# Patient Record
Sex: Female | Born: 1990 | Race: Black or African American | Hispanic: No | Marital: Single | Smoking: Former smoker
Health system: Southern US, Community
[De-identification: ages and names within clinical notes are randomized; demographics above are authoritative.]

## PROBLEM LIST (undated history)

## (undated) ENCOUNTER — Inpatient Hospital Stay (HOSPITAL_COMMUNITY): Payer: Self-pay

## (undated) DIAGNOSIS — B9689 Other specified bacterial agents as the cause of diseases classified elsewhere: Secondary | ICD-10-CM

## (undated) DIAGNOSIS — A749 Chlamydial infection, unspecified: Secondary | ICD-10-CM

## (undated) DIAGNOSIS — N760 Acute vaginitis: Secondary | ICD-10-CM

## (undated) DIAGNOSIS — D649 Anemia, unspecified: Secondary | ICD-10-CM

---
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
## 2011-04-02 NOTE — L&D Delivery Note (Signed)
Delivery Note At 4:07 AM a viable female was delivered via Vaginal, Spontaneous Delivery (Presentation: left occiput anterior )  APGAR: 9, 10; weight 7 lb 1.2 oz (3210 g).   Placenta status: Intact, Expressed.  Cord: 3 vessels with the following complications: None.   Pt came with8 cm and complete limited and late prenatal care. Dating by 18 weeks ultrasound. Prior low transverse C section.  Anesthesia: None  Episiotomy: None Lacerations: 1st degree Suture Repair: 3.0 vicryl rapide Est. Blood Loss (mL): 450  Mom to postpartum.  Baby to nursery-stable.  PILOTO, Maycen Degregory 02/20/2012, 4:47 AM

## 2011-04-02 NOTE — L&D Delivery Note (Signed)
I was present for entire delivery and repair. Patient admitted at 8 cm with no prenatal care and history of c-section for arrest of descent with 8 lb baby. Patient had SROM at 8 cm and became completely dilated within 10 minutes. She pushed through several contractions and delivered with no complications. Baby cried vigorously immediately after delivery.  Napoleon Form, MD

## 2011-05-01 ENCOUNTER — Encounter (HOSPITAL_COMMUNITY): Payer: Self-pay | Admitting: *Deleted

## 2011-05-01 ENCOUNTER — Emergency Department (INDEPENDENT_AMBULATORY_CARE_PROVIDER_SITE_OTHER)
Admission: EM | Admit: 2011-05-01 | Discharge: 2011-05-01 | Disposition: A | Discharge status: Home / Self Care | Payer: Medicaid Other | Source: Home / Self Care | Attending: Family Medicine | Admitting: Family Medicine

## 2011-05-01 DIAGNOSIS — R112 Nausea with vomiting, unspecified: Secondary | ICD-10-CM

## 2011-05-01 MED ORDER — ONDANSETRON 4 MG PO TBDP
ORAL_TABLET | ORAL | Status: AC
  Filled 2011-05-01: qty 1

## 2011-05-01 MED ORDER — ONDANSETRON HCL 4 MG PO TABS: 4.0000 mg | ORAL_TABLET | Freq: Four times a day (QID) | ORAL | Status: AC

## 2011-05-01 MED ORDER — ONDANSETRON 4 MG PO TBDP
4.0000 mg | ORAL_TABLET | Freq: Once | ORAL | Status: AC
  Administered 2011-05-01: 4 mg via ORAL

## 2011-05-01 NOTE — ED Provider Notes (Signed)
History     CSN: [id number]  Arrival date & time 05/01/11  1806   First MD Initiated Contact with Patient 05/01/11 1908      Chief Complaint  Patient presents with  . Emesis    (Consider location/radiation/quality/duration/timing/severity/associated sxs/prior treatment) Patient is a 21 y.o. female presenting with vomiting. The history is provided by the patient.  Emesis  This is a new problem. The current episode started 6 to 12 hours ago. The problem has been resolved (feels like going out now to eat some fried chicken). The emesis has an appearance of stomach contents. There has been no fever. Pertinent negatives include no chills, no diarrhea and no fever. Risk factors include ill contacts.    History reviewed. No pertinent past medical history.  History reviewed. No pertinent past surgical history.  History reviewed. No pertinent family history.  History  Substance Use Topics  . Smoking status: Current Everyday Smoker  . Smokeless tobacco: Not on file  . Alcohol Use:     OB History    Grav Para Term Preterm Abortions TAB SAB Ect Mult Living                  Review of Systems  Constitutional: Negative for fever and chills.  HENT: Negative.   Respiratory: Negative.   Cardiovascular: Negative.   Gastrointestinal: Positive for vomiting. Negative for diarrhea.    Allergies  Review of patient's allergies indicates no known allergies.  Home Medications   Current Outpatient Rx  Name Route Sig Dispense Refill  . ONDANSETRON HCL 4 MG PO TABS Oral Take 1 tablet (4 mg total) by mouth every 6 (six) hours. 6 tablet 0    BP 107/63  Pulse 72  Temp(Src) 98.5 F (36.9 C) (Oral)  Resp 16  SpO2 100%  LMP 04/27/2011  Physical Exam  Nursing note and vitals reviewed. Constitutional: She is oriented to person, place, and time. She appears well-developed and well-nourished.  Abdominal: Soft. Bowel sounds are normal. She exhibits no mass. There is no tenderness. There is  no rebound and no guarding.  Neurological: She is alert and oriented to person, place, and time.  Skin: Skin is warm and dry.  Psychiatric: She has a normal mood and affect.    ED Course  Procedures (including critical care time)  Labs Reviewed - No data to display No results found.   1. N&V - nausea and vomiting       MDM         Barkley Bruns, MD 05/03/11 [phone]

## 2011-05-01 NOTE — ED Notes (Signed)
Pt  States  She  Has  Had  Some  Vomiting  Earlier  Today    Better  Now  Had  Some  Mild  abd  Cramping   Earlier      No  Diarrhea  virus  Is  Going  Around  Her  Family

## 2011-07-08 ENCOUNTER — Encounter (HOSPITAL_COMMUNITY): Payer: Self-pay | Admitting: Emergency Medicine

## 2011-07-08 ENCOUNTER — Emergency Department (HOSPITAL_COMMUNITY)
Admission: EM | Admit: 2011-07-08 | Discharge: 2011-07-08 | Disposition: A | Discharge status: Home / Self Care | Payer: Medicaid Other | Attending: Emergency Medicine | Admitting: Emergency Medicine

## 2011-07-08 DIAGNOSIS — Z331 Pregnant state, incidental: Secondary | ICD-10-CM | POA: Insufficient documentation

## 2011-07-08 DIAGNOSIS — N390 Urinary tract infection, site not specified: Secondary | ICD-10-CM | POA: Insufficient documentation

## 2011-07-08 DIAGNOSIS — O219 Vomiting of pregnancy, unspecified: Secondary | ICD-10-CM | POA: Insufficient documentation

## 2011-07-08 DIAGNOSIS — R197 Diarrhea, unspecified: Secondary | ICD-10-CM | POA: Insufficient documentation

## 2011-07-08 DIAGNOSIS — O2390 Unspecified genitourinary tract infection in pregnancy, unspecified trimester: Secondary | ICD-10-CM | POA: Insufficient documentation

## 2011-07-08 LAB — URINALYSIS, ROUTINE W REFLEX MICROSCOPIC
Glucose, UA: NEGATIVE mg/dL
Hgb urine dipstick: NEGATIVE
Specific Gravity, Urine: 1.02 (ref 1.005–1.030)
pH: 8.5 — ABNORMAL HIGH (ref 5.0–8.0)

## 2011-07-08 LAB — PREGNANCY, URINE: Preg Test, Ur: POSITIVE — AB

## 2011-07-08 LAB — URINE MICROSCOPIC-ADD ON

## 2011-07-08 MED ORDER — SODIUM CHLORIDE 0.9 % IV BOLUS (SEPSIS)
1000.0000 mL | Freq: Once | INTRAVENOUS | Status: AC
  Administered 2011-07-08: 1000 mL via INTRAVENOUS

## 2011-07-08 MED ORDER — PRENATAL COMPLETE 14-0.4 MG PO TABS: 1.0000 | ORAL_TABLET | Freq: Every day | ORAL | Status: DC

## 2011-07-08 MED ORDER — ONDANSETRON HCL 4 MG/2ML IJ SOLN
4.0000 mg | Freq: Once | INTRAMUSCULAR | Status: AC
  Administered 2011-07-08: 4 mg via INTRAVENOUS
  Filled 2011-07-08: qty 2

## 2011-07-08 MED ORDER — ONDANSETRON HCL 4 MG PO TABS: 4.0000 mg | ORAL_TABLET | Freq: Three times a day (TID) | ORAL | Status: AC | PRN

## 2011-07-08 MED ORDER — NITROFURANTOIN MONOHYD MACRO 100 MG PO CAPS: 100.0000 mg | ORAL_CAPSULE | Freq: Two times a day (BID) | ORAL | Status: DC

## 2011-07-08 NOTE — ED Notes (Signed)
Pt reports vomiting starting Friday, unable to keep anything down, vomiting since 5am today, previous days was more intermittent. No fevers. No abd pain.

## 2011-07-08 NOTE — Discharge Instructions (Signed)
Read the information below.  Please call your gynecologist this week to schedule a follow up appointment.  Drink plenty of fluids and try to eat regular small meals throughout the day. It is important that you take a daily prenatal vitamin and avoid alcohol, drugs or medications not prescribed or approved by your doctor, smoking cigarettes, and caffeine. If you develop abdominal pain, vaginal bleeding, or abnormal vaginal discharge, please go directly to the [facility].  You may return to the ER at any time for worsening condition or any new symptoms that concern you.  Medicines During Pregnancy Medications should be avoided during the first 3 months of pregnancy unless they are essential to the health of the mother and baby. Some medicines have small risks when taken at certain times in pregnancy. Birth control pills in very early pregnancy do not cause fetal damage.  Certain drugs cause problems in babies if taken during pregnancy. These should be avoided completely. Talk to your caregiver before taking any medicines (see below).  Female hormones.   Female sex hormones.   Some antibiotics.   Seizure medicines.   Tranquilizers, sleeping pills and antidepressants.   Cortisone-like drugs.   Migraine prevention drugs.   Anti-inflammatories (Aspirin, Motrin and Advil).   Any over-the-counter medications.   Herbal medications.   Water pills and some blood pressure medications.   Smoking can also interfere with normal development. This also causes a higher incidence of drug use in the adolescent years.   Lithium.   Cancer treatments and drugs.   Drugs used to treat an overactive thyroid.   Medications to prevent rejection after a transplant.   Medications used to treat autoimmune disorders.   Alcohol and illicit drugs such as cocaine, heroin and speed.  Check with your caregiver if you want to know more about taking certain drugs while pregnant.  Folate (400 micrograms  per day), calcium and iron supplements are needed in all pregnancies. Take all prenatal vitamins as directed. If iron is constipating or upsets the stomach, eat right before you take the vitamin. Ask your caregiver what type of stool softener they recommend. Document Released: 03/18/2005 Document Revised: 03/07/2011 Document Reviewed: 03/01/2011 [facility] Patient Information 2012 [place], [place].  Pregnancy If you are planning on getting pregnant, it is a good idea to make a preconception appointment with your care- giver to discuss having a healthy lifestyle before getting pregnant. Such as, diet, weight, exercise, taking prenatal vitamins especially folic acid (it helps prevent brain and spinal cord defects), avoiding alcohol, smoking and illegal drugs, medical problems (diabetes, convulsions), family history of genetic problems, working conditions and immunizations. It is better to have knowledge of these things and do something about them before getting pregnant. In your pregnancy, it is important to follow certain guidelines to have a healthy baby. It is very important to get good prenatal care and follow your caregiver's instructions. Prenatal care includes all the medical care you receive before your baby's birth. This helps to prevent problems during the pregnancy and childbirth. HOME CARE INSTRUCTIONS   Start your prenatal visits by the 12th week of pregnancy or before when possible. They are usually scheduled monthly at first. They are more often in the last 2 months before delivery. It is important that you keep your caregiver's appointments and follow your caregiver's instructions regarding medication use, exercise, and diet.   During pregnancy, you are providing food for you and your baby. Eat a regular, well-balanced diet. Choose foods such as meat, fish,  milk and other dairy products, vegetables, fruits, whole-grain breads and cereals. Your caregiver will inform you of the ideal weight gain  depending on your current height and weight. Drink lots of liquids. Try to drink 8 glasses of water a day.   Alcohol is associated with a number of birth defects including fetal alcohol syndrome. It is best to avoid alcohol completely. Smoking will cause low birth rate and prematurity. Use of alcohol and nicotine during your pregnancy also increases the chances that your child will be chemically dependent later in their life and may contribute to SIDS (Sudden Infant Death Syndrome).   Do not use illegal drugs.   Only take prescription or over-the-counter medications that are recommended by your caregiver. Other medications can cause genetic and physical problems in the baby.   Morning sickness can often be helped by keeping soda crackers at the bedside. Eat a couple before arising in the morning.   A sexual relationship may be continued until near the end of pregnancy if there are no other problems such as early (premature) leaking of amniotic fluid from the membranes, vaginal bleeding, painful intercourse or belly (abdominal) pain.   Exercise regularly. Check with your caregiver if you are unsure of the safety of some of your exercises.   Do not use hot tubs, steam rooms or saunas. These increase the risk of fainting or passing out and hurting yourself and the baby. Swimming is OK for exercise. Get plenty of rest, including afternoon naps when possible especially in the third trimester.   Avoid toxic odors and chemicals.   Do not wear high heels. They may cause you to lose your balance and fall.   Do not lift over 5 pounds. If you do lift anything, lift with your legs and thighs, not your back.   Avoid long trips, especially in the third trimester.   If you have to travel out of the city or state, take a copy of your medical records with you.  SEEK IMMEDIATE MEDICAL CARE IF:   You develop an unexplained oral temperature above 102 F (38.9 C), or as your caregiver suggests.   You have  leaking of fluid from the vagina. If leaking membranes are suspected, take your temperature and inform your caregiver of this when you call.   There is vaginal spotting or bleeding. Notify your caregiver of the amount and how many pads are used.   You continue to feel sick to your stomach (nauseous) and have no relief from remedies suggested, or you throw up (vomit) blood or coffee ground like materials.   You develop upper abdominal pain.   You have round ligament discomfort in the lower abdominal area. This still must be evaluated by your caregiver.   You feel contractions of the uterus.   You do not feel the baby move, or there is less movement than before.   You have painful urination.   You have abnormal vaginal discharge.   You have persistent diarrhea.   You get a severe headache.   You have problems with your vision.   You develop muscle weakness.   You feel dizzy and faint.   You develop shortness of breath.   You develop chest pain.   You have back pain that travels down to your leg and feet.   You feel irregular or a very fast heartbeat.   You develop excessive weight gain in a short period of time (5 pounds in 3 to 5 days).   You  are involved with a domestic violence situation.  Document Released: 03/18/2005 Document Revised: 03/07/2011 Document Reviewed: 09/09/2008 [facility] Patient Information 2012 [place], [place].  Urinary Tract Infection in Pregnancy A urinary tract infection (UTI) is a bacterial infection of the urinary tract. Infection of the urinary tract can include the ureters, kidneys (pyelonephritis), bladder (cystitis), and urethra (urethritis). All pregnant women should be screened for bacteria in the urinary tract. Identifying and treating a UTI will decrease the risk of preterm labor and developing more serious infections in both the mother and baby. CAUSES Bacteria germs cause almost all UTIs. There are many factors that can increase your  chances of getting a UTI during pregnancy. These include:  Having a short urethra.   Poor toilet and hygiene habits.   Sexual intercourse.   Blockage of urine along the urinary tract.   Problems with the pelvic muscles or nerves.   Diabetes.   Obesity.   Bladder problems after having several children.   Previous history of UTI.  SYMPTOMS   Pain, burning, or a stinging feeling when urinating.   Suddenly feeling the need to urinate right away (urgency).   Loss of bladder control (urinary incontinence).   Frequent urination, more than is common with pregnancy.   Lower abdominal or back discomfort.   Bad smelling urine.   Cloudy urine.   Blood in the urine (hematuria).   Fever.  When the kidneys are infected, the symptoms may be:  Back pain.   Flank pain on the right side more so than the left.   Fever.   Chills.   Nausea.   Vomiting.  DIAGNOSIS   Urine tests.   Additional tests and procedures may include:   Ultrasound of the kidneys, ureters, bladder, and urethra.   Looking in the bladder with a lighted tube (cystoscopy).   Certain X-ray studies only when absolutely necessary.  Finding out the results of your test Ask when your test results will be ready. Make sure you get your test results. TREATMENT  Antibiotic medicine by mouth.   Antibiotics given through the vein (intravenously), if needed.  HOME CARE INSTRUCTIONS   Take your antibiotics as directed. Finish them even if you start to feel better. Only take medicine as directed by your caregiver.   Drink enough fluids to keep your urine clear or pale yellow.   Do not have sexual intercourse until the infection is gone and your caregiver says it is okay.   Make sure you are tested for UTIs throughout your pregnancy if you get one. These infections often come back.  Preventing a UTI in the future:  Practice good toilet habits. Always wipe from front to back. Use the tissue only once.    Do not hold your urine. Empty your bladder as soon as possible when the urge comes.   Do not douche or use deodorant sprays.   Wash with soap and warm water around the genital area and the anus.   Empty your bladder before and after sexual intercourse.   Wear underwear with a cotton crotch.   Avoid caffeine and carbonated drinks. They can irritate the bladder.   Drink cranberry juice or take cranberry pills. This may decrease the risk of getting a UTI.   Do not drink alcohol.   Keep all your appointments and tests as scheduled.  SEEK MEDICAL CARE IF:   Your symptoms get worse.   You are still having fevers 2 or more days after treatment begins.   You develop  a rash.   You feel that you are having problems with medicines prescribed.   You develop abnormal vaginal discharge.  SEEK IMMEDIATE MEDICAL CARE IF:   You develop back or flank pain.   You develop chills.   You have blood in your urine.   You develop nausea and vomiting.   You develop contractions of your uterus.   You have a gush of fluid from the vagina.  MAKE SURE YOU:   Understand these instructions.   Will watch your condition.   Will get help right away if you are not doing well or get worse.  Document Released: 07/13/2010 Document Revised: 03/07/2011 Document Reviewed: 07/13/2010 [facility] Patient Information 2012 [place], [place].

## 2011-07-08 NOTE — ED Notes (Signed)
Pt states she thinks she is pregnant. States lmp 2/28. Denies vaginal bleeding or discharge.

## 2011-07-08 NOTE — ED Provider Notes (Signed)
History     CSN: [id number]  Arrival date & time 07/08/11  1332   First MD Initiated Contact with Patient 07/08/11 1422      Chief Complaint  Patient presents with  . Emesis    (Consider location/radiation/quality/duration/timing/severity/associated sxs/prior treatment) HPI Comments: Patient reports she has had N/V/D all day today.  Prior to this, patient states she has vomited about once a day for the past few days.  Is having urinary urgency.  Last menstrual period was 05/30/11.  Pt took a home pregnancy test that was positive, but she was not confident in the results.  Denies fever, abdominal pain, vaginal bleeding or discharge.  Prior to this pregnancy, patient is G1P1001.    The history is provided by the patient.    History reviewed. No pertinent past medical history.  History reviewed. No pertinent past surgical history.  History reviewed. No pertinent family history.  History  Substance Use Topics  . Smoking status: Current Everyday Smoker -- 1.0 packs/day    Types: Cigarettes  . Smokeless tobacco: Not on file  . Alcohol Use: No    OB History    Grav Para Term Preterm Abortions TAB SAB Ect Mult Living                  Review of Systems  Constitutional: Negative for fever.  HENT: Negative for ear pain, sore throat, rhinorrhea and trouble swallowing.   Respiratory: Negative for cough and shortness of breath.   Cardiovascular: Negative for chest pain.  Gastrointestinal: Positive for nausea, vomiting and diarrhea. Negative for abdominal pain, constipation and abdominal distention.  Genitourinary: Positive for urgency and menstrual problem. Negative for dysuria, vaginal bleeding, vaginal discharge and vaginal pain.  All other systems reviewed and are negative.    Allergies  Review of patient's allergies indicates no known allergies.  Home Medications  No current outpatient prescriptions on file.  BP 103/69  Pulse 86  Temp(Src) 97.4 F (36.3 C) (Oral)   Resp 20  SpO2 100%  LMP 05/30/2011  Physical Exam  Nursing note and vitals reviewed. Constitutional: She is oriented to person, place, and time. She appears well-developed and well-nourished. No distress.  HENT:  Head: Normocephalic and atraumatic.  Neck: Neck supple.  Cardiovascular: Normal rate, regular rhythm and normal heart sounds.   Pulmonary/Chest: Effort normal and breath sounds normal. No respiratory distress. She has no wheezes. She has no rales. She exhibits no tenderness.  Abdominal: Soft. Bowel sounds are normal. She exhibits no distension and no mass. There is no tenderness. There is no rigidity, no rebound, no guarding, no CVA tenderness, no tenderness at McBurney's point and negative Murphy's sign.  Neurological: She is alert and oriented to person, place, and time.  Skin: She is not diaphoretic.  Psychiatric: She has a normal mood and affect. Her behavior is normal. Judgment and thought content normal.    ED Course  Procedures (including critical care time)  Labs Reviewed  PREGNANCY, URINE - Abnormal; Notable for the following:    Preg Test, Ur POSITIVE (*)    All other components within normal limits  URINALYSIS, ROUTINE W REFLEX MICROSCOPIC - Abnormal; Notable for the following:    APPearance CLOUDY (*)    pH 8.5 (*)    Ketones, ur >80 (*)    Protein, ur 30 (*)    Leukocytes, UA LARGE (*)    All other components within normal limits  URINE MICROSCOPIC-ADD ON - Abnormal; Notable for the following:  Squamous Epithelial / LPF MANY (*)    Bacteria, UA MANY (*)    All other components within normal limits  URINE CULTURE   No results found.  3:47 PM Patient reports she is feeling much better after zofran and IVF.  Will do PO trial.  Patient is pregnant, but without abdominal pain or vaginal bleeding.  Has gyn at [facility].  Also has UTI.  Will treat with macrobid.   1. Nausea vomiting and diarrhea   2. Pregnancy as incidental finding   3. UTI  (lower urinary tract infection)       MDM  Nontoxic, afebrile patient with N/V/D and intermittent episodes of vomiting over the past few days.  LMP 05/30/11, patient is not on birth control - found to be pregnant.  No abdominal pain of bleeding.  Nausea controlled in ED with zofran.  Pt also found to have UTI. Discussed home care instructions with patient and encouraged close follow up with obgyn.  Return (to women's) precautions discussed.  Patient verbalizes understanding and agrees with plan.          Rise Patience, [place] 07/08/11 1600

## 2011-07-09 LAB — URINE CULTURE
Colony Count: 30000
Culture  Setup Time: 201304081600

## 2011-07-09 NOTE — ED Provider Notes (Signed)
Medical screening examination/treatment/procedure(s) were performed by non-physician practitioner and as supervising physician I was immediately available for consultation/collaboration.  Raeford Razor, MD 07/09/11 [phone]

## 2011-07-15 ENCOUNTER — Encounter (HOSPITAL_COMMUNITY): Payer: Self-pay | Admitting: *Deleted

## 2011-07-15 ENCOUNTER — Inpatient Hospital Stay (HOSPITAL_COMMUNITY)
Admission: AD | Admit: 2011-07-15 | Discharge: 2011-07-15 | Disposition: A | Discharge status: Hospice | Payer: Medicaid Other | Source: Ambulatory Visit | Attending: Obstetrics | Admitting: Obstetrics

## 2011-07-15 DIAGNOSIS — K529 Noninfective gastroenteritis and colitis, unspecified: Secondary | ICD-10-CM

## 2011-07-15 DIAGNOSIS — O210 Mild hyperemesis gravidarum: Secondary | ICD-10-CM | POA: Insufficient documentation

## 2011-07-15 DIAGNOSIS — R197 Diarrhea, unspecified: Secondary | ICD-10-CM | POA: Insufficient documentation

## 2011-07-15 DIAGNOSIS — K5289 Other specified noninfective gastroenteritis and colitis: Secondary | ICD-10-CM | POA: Insufficient documentation

## 2011-07-15 DIAGNOSIS — O99891 Other specified diseases and conditions complicating pregnancy: Secondary | ICD-10-CM | POA: Insufficient documentation

## 2011-07-15 HISTORY — DX: Anemia, unspecified: D64.9

## 2011-07-15 LAB — URINE MICROSCOPIC-ADD ON

## 2011-07-15 LAB — URINALYSIS, ROUTINE W REFLEX MICROSCOPIC
Bilirubin Urine: NEGATIVE
Ketones, ur: >80 mg/dL — AB
Protein, ur: NEGATIVE mg/dL
Specific Gravity, Urine: 1.02 (ref 1.005–1.030)
Urobilinogen, UA: 1 mg/dL (ref 0.0–1.0)

## 2011-07-15 MED ORDER — ONDANSETRON 8 MG PO TBDP: 8.0000 mg | ORAL_TABLET | Freq: Three times a day (TID) | ORAL | Status: AC | PRN

## 2011-07-15 MED ORDER — LOPERAMIDE HCL 2 MG PO TABS: 2.0000 mg | ORAL_TABLET | ORAL | Status: DC

## 2011-07-15 MED ORDER — PROMETHAZINE HCL 25 MG/ML IJ SOLN
25.0000 mg | Freq: Once | INTRAVENOUS | Status: AC
  Administered 2011-07-15: 25 mg via INTRAVENOUS
  Filled 2011-07-15: qty 1

## 2011-07-15 MED ORDER — PROMETHAZINE HCL 25 MG RE SUPP: 25.0000 mg | Freq: Four times a day (QID) | RECTAL | Status: DC | PRN

## 2011-07-15 NOTE — MAU Provider Note (Signed)
History   [name] is a 21 y.o. year old G32P1001 female at 6.[redacted] weeks gestation who presents to MAU reporting worsening or N/V and new onset of diarrhea this morning, ~12 episode of emesis and 4 episodes of diarrhea.    CSN: [id number]  Arrival date & time 07/15/11  1504   None     Chief Complaint  Patient presents with  . Nausea  . Diarrhea    (Consider location/radiation/quality/duration/timing/severity/associated sxs/prior treatment) HPI  Past Medical History  Diagnosis Date  . Anemia     Past Surgical History  Procedure Date  . Cesarean section     Family History  Problem Relation Age of Onset  . Cancer Maternal Aunt     History  Substance Use Topics  . Smoking status: Former Smoker -- 1.0 packs/day    Types: Cigarettes    Quit date: 07/12/2011  . Smokeless tobacco: Not on file  . Alcohol Use: No    OB History    Grav Para Term Preterm Abortions TAB SAB Ect Mult Living   2 1 1       1       Review of Systems  Constitutional: Negative for fever and chills.  Gastrointestinal: Negative for abdominal pain.  Genitourinary: Negative for dysuria and flank pain.    Allergies  Review of patient's allergies indicates no known allergies.  Home Medications  No current outpatient prescriptions on file.  BP 103/82  Pulse 87  Temp(Src) 97.9 F (36.6 C) (Oral)  Resp 16  Ht 5\' 7"  (1.702 m)  Wt 48.988 kg (108 lb)  BMI 16.92 kg/m2  SpO2 100%  LMP 05/30/2011  Physical Exam  Nursing note and vitals reviewed. Constitutional: She is oriented to person, place, and time. She appears well-developed and well-nourished. No distress.  Cardiovascular: Normal rate.   Pulmonary/Chest: Effort normal.  Abdominal: Soft. Bowel sounds are normal. She exhibits no distension. There is no tenderness.  Neurological: She is alert and oriented to person, place, and time.  Skin: Skin is warm and dry.  Psychiatric: She has a normal mood and affect.    ED Course    Procedures (including critical care time)  Results for orders placed during the hospital encounter of 07/15/11 (from the past 72 hour(s))  URINALYSIS, ROUTINE W REFLEX MICROSCOPIC     Status: Abnormal   Collection Time   07/15/11  3:30 PM      Component Value Range Comment   Color, Urine YELLOW  YELLOW     APPearance CLOUDY (*) CLEAR     Specific Gravity, Urine 1.020  1.005 - 1.030     pH 8.5 (*) 5.0 - 8.0     Glucose, UA NEGATIVE  NEGATIVE (mg/dL)    Hgb urine dipstick TRACE (*) NEGATIVE     Bilirubin Urine NEGATIVE  NEGATIVE     Ketones, ur >80 (*) NEGATIVE (mg/dL)    Protein, ur NEGATIVE  NEGATIVE (mg/dL)    Urobilinogen, UA 1.0  0.0 - 1.0 (mg/dL)    Nitrite NEGATIVE  NEGATIVE     Leukocytes, UA SMALL (*) NEGATIVE    URINE MICROSCOPIC-ADD ON     Status: Abnormal   Collection Time   07/15/11  3:30 PM      Component Value Range Comment   Squamous Epithelial / LPF FEW (*) RARE     WBC, UA 11-20  <3 (WBC/hpf)    RBC / HPF 3-6  <3 (RBC/hpf)    Bacteria, UA MANY (*) RARE  Urine-Other TRICHOMONAS PRESENT     Tolerating PO's after IV Phenergan. Denies further Nausea/Vomiting.  1. Gastroenteritis, acute    MDM  D/C home Follow-up Information    Follow up with HARPER,CHARLES A, MD. (as scheduled or MAU as needed if symptoms worsen)    Contact information:   [address] [place] [address] [phone]         Medication List  As of 07/17/2011  7:59 PM   START taking these medications         loperamide 2 MG tablet   Commonly known as: IMODIUM A-D   Take 1 tablet (2 mg total) by mouth as directed.      ondansetron 8 MG disintegrating tablet   Commonly known as: ZOFRAN-ODT   Take 1 tablet (8 mg total) by mouth every 8 (eight) hours as needed for nausea.      promethazine 25 MG suppository   Commonly known as: PHENERGAN   Place 1 suppository (25 mg total) rectally every 6 (six) hours as needed for nausea. May be used vaginally          CONTINUE taking these medications         ondansetron 4 MG tablet   Commonly known as: ZOFRAN   Take 1 tablet (4 mg total) by mouth every 8 (eight) hours as needed for nausea.      Prenatal Complete 14-0.4 MG Tabs   Take 1 tablet by mouth daily.         STOP taking these medications         nitrofurantoin (macrocrystal-monohydrate) 100 MG capsule          Where to get your medications    These are the prescriptions that you need to pick up.   You may get these medications from any pharmacy.         loperamide 2 MG tablet   ondansetron 8 MG disintegrating tablet   promethazine 25 MG suppository          BRAT diet Increase rest and fluids  Dorathy Kinsman 07/15/2011 4:36 PM

## 2011-07-15 NOTE — Discharge Instructions (Signed)
B.R.A.T. Diet  Your doctor has recommended the B.R.A.T. diet for you or your child until the condition improves. This is often used to help control diarrhea and vomiting symptoms. If you or your child can tolerate clear liquids, you may have:   Bananas.   Rice.   Applesauce.   Toast (and other simple starches such as crackers, potatoes, noodles).  Be sure to avoid dairy products, meats, and fatty foods until symptoms are better. Fruit juices such as apple, grape, and prune juice can make diarrhea worse. Avoid these. Continue this diet for 2 days or as instructed by your caregiver.  Document Released: 03/18/2005 Document Revised: 03/07/2011 Document Reviewed: 09/04/2006  [employer] Patient Information 2012 [employer]    B.R.A.T. Diet  Your doctor has recommended the B.R.A.T. diet for you or your child until the condition improves. This is often used to help control diarrhea and vomiting symptoms. If you or your child can tolerate clear liquids, you may have:   Bananas.   Rice.   Applesauce.   Toast (and other simple starches such as crackers, potatoes, noodles).  Be sure to avoid dairy products, meats, and fatty foods until symptoms are better. Fruit juices such as apple, grape, and prune juice can make diarrhea worse. Avoid these. Continue this diet for 2 days or as instructed by your caregiver.  Document Released: 03/18/2005 Document Revised: 03/07/2011 Document Reviewed: 09/04/2006  [employer] Patient Information 2012 [employer]

## 2011-07-15 NOTE — MAU Note (Signed)
Pt has been on Zofran for 1 week and it was effective, last night vomiting & diarrhea started .

## 2011-07-15 NOTE — MAU Note (Signed)
Patient states she has been having nausea and vomiting with the pregnancy, getting worse and diarrhea started today. No pain, bleeding.

## 2011-07-15 NOTE — MAU Note (Signed)
EMS arrival- report from EMT:  N/V/D started this morning.  lmp 02/28.  On rx for UTI.  Pt in lobby being registered.  Reg called to place a mask on pt.

## 2011-07-18 NOTE — MAU Provider Note (Signed)
Agree with above note.  Julia Alkhatib 07/18/2011 9:21 AM

## 2011-07-24 ENCOUNTER — Emergency Department (HOSPITAL_COMMUNITY)
Admission: EM | Admit: 2011-07-24 | Discharge: 2011-07-24 | Disposition: A | Discharge status: Home / Self Care | Payer: Medicaid Other | Attending: Emergency Medicine | Admitting: Emergency Medicine

## 2011-07-24 ENCOUNTER — Encounter (HOSPITAL_COMMUNITY): Payer: Self-pay | Admitting: Emergency Medicine

## 2011-07-24 DIAGNOSIS — O210 Mild hyperemesis gravidarum: Secondary | ICD-10-CM

## 2011-07-24 DIAGNOSIS — O2390 Unspecified genitourinary tract infection in pregnancy, unspecified trimester: Secondary | ICD-10-CM | POA: Insufficient documentation

## 2011-07-24 DIAGNOSIS — N390 Urinary tract infection, site not specified: Secondary | ICD-10-CM | POA: Insufficient documentation

## 2011-07-24 LAB — COMPREHENSIVE METABOLIC PANEL
ALT: 9 U/L (ref 0–35)
Alkaline Phosphatase: 41 U/L (ref 39–117)
BUN: 6 mg/dL (ref 6–23)
CO2: 18 mEq/L — ABNORMAL LOW (ref 19–32)
GFR calc Af Amer: >90 mL/min (ref >90)
GFR calc non Af Amer: >90 mL/min (ref >90)
Glucose, Bld: 91 mg/dL (ref 70–99)
Potassium: 3.5 mEq/L (ref 3.5–5.1)
Sodium: 134 mEq/L — ABNORMAL LOW (ref 135–145)
Total Protein: 7.9 g/dL (ref 6.0–8.3)

## 2011-07-24 LAB — CBC
HCT: 38.1 % (ref 36.0–46.0)
Hemoglobin: 13 g/dL (ref 12.0–15.0)
MCH: 27.9 pg (ref 26.0–34.0)
MCHC: 34.1 g/dL (ref 30.0–36.0)
RBC: 4.66 MIL/uL (ref 3.87–5.11)

## 2011-07-24 LAB — DIFFERENTIAL
Basophils Relative: 0 % (ref 0–1)
Eosinophils Absolute: 0 10*3/uL (ref 0.0–0.7)
Lymphs Abs: 1.4 10*3/uL (ref 0.7–4.0)
Monocytes Absolute: 0.4 10*3/uL (ref 0.1–1.0)
Monocytes Relative: 7 % (ref 3–12)
Neutro Abs: 3.7 10*3/uL (ref 1.7–7.7)

## 2011-07-24 LAB — URINE MICROSCOPIC-ADD ON

## 2011-07-24 LAB — URINALYSIS, ROUTINE W REFLEX MICROSCOPIC
Bilirubin Urine: NEGATIVE
Ketones, ur: >80 mg/dL — AB
Nitrite: NEGATIVE
Protein, ur: 30 mg/dL — AB
Urobilinogen, UA: 1 mg/dL (ref 0.0–1.0)
pH: 8.5 — ABNORMAL HIGH (ref 5.0–8.0)

## 2011-07-24 MED ORDER — SODIUM CHLORIDE 0.9 % IV BOLUS (SEPSIS): 1000.0000 mL | Freq: Once | INTRAVENOUS | Status: DC

## 2011-07-24 MED ORDER — CEPHALEXIN 500 MG PO CAPS: 500.0000 mg | ORAL_CAPSULE | Freq: Four times a day (QID) | ORAL | Status: AC

## 2011-07-24 MED ORDER — ONDANSETRON 8 MG PO TBDP: 8.0000 mg | ORAL_TABLET | Freq: Three times a day (TID) | ORAL | Status: AC | PRN

## 2011-07-24 MED ORDER — SODIUM CHLORIDE 0.9 % IV SOLN
Freq: Once | INTRAVENOUS | Status: AC
  Administered 2011-07-24: 10:00:00 via INTRAVENOUS

## 2011-07-24 MED ORDER — SODIUM CHLORIDE 0.9 % IV BOLUS (SEPSIS)
1000.0000 mL | Freq: Once | INTRAVENOUS | Status: AC
  Administered 2011-07-24: 1000 mL via INTRAVENOUS

## 2011-07-24 MED ORDER — ONDANSETRON HCL 4 MG/2ML IJ SOLN
4.0000 mg | Freq: Once | INTRAMUSCULAR | Status: AC
  Administered 2011-07-24: 4 mg via INTRAVENOUS
  Filled 2011-07-24: qty 2

## 2011-07-24 NOTE — Discharge Instructions (Signed)
Hyperemesis Gravidarum Hyperemesis gravidarum is a severe form of nausea and vomiting that happens during pregnancy. Hyperemesis is worse than morning sickness. It may cause a woman to have nausea or vomiting all day for many days. It may keep a woman from eating and drinking enough food and liquids. Hyperemesis usually occurs during the first half (the first 20 weeks) of pregnancy. It often goes away once a woman is in her second half of pregnancy. However, sometimes hyperemesis continues through an entire pregnancy.  CAUSES  The cause of this condition is not completely known but is thought to be due to changes in the body's hormones when pregnant. It could be the high level of the pregnancy hormone or an increase in estrogen in the body.  SYMPTOMS   Severe nausea and vomiting.   Nausea that does not go away.   Vomiting that does not allow you to keep any food down.   Weight loss and body fluid loss (dehydration).   Having no desire to eat or not liking food you have previously enjoyed.  DIAGNOSIS  Your caregiver may ask you about your symptoms. Your caregiver may also order blood tests and urine tests to make sure something else is not causing the problem.  TREATMENT  You may only need medicine to control the problem. If medicines do not control the nausea and vomiting, you will be treated in the hospital to prevent dehydration, acidosis, weight loss, and changes in the electrolytes in your body that may harm the unborn baby (fetus). You may need intravenous (IV) fluids.  HOME CARE INSTRUCTIONS   Take all medicine as directed by your caregiver.   Try eating a couple of dry crackers or toast in the morning before getting out of bed.   Avoid foods and smells that upset your stomach.   Avoid fatty and spicy foods. Eat 5 to 6 small meals a day.   Do not drink when eating meals. Drink between meals.   For snacks, eat high protein foods, such as cheese. Eat or suck on things that have  ginger in them. Ginger helps nausea.   Avoid food preparation. The smell of food can spoil your appetite.   Avoid iron pills and iron in your multivitamins until after 3 to 4 months of being pregnant.  SEEK MEDICAL CARE IF:   Your abdominal pain increases since the last time you saw your caregiver.   You have a severe headache.   You develop vision problems.   You feel you are losing weight.  SEEK IMMEDIATE MEDICAL CARE IF:   You are unable to keep fluids down.   You vomit blood.   You have constant nausea and vomiting.   You have a fever.   You have excessive weakness, dizziness, fainting, or extreme thirst.  MAKE SURE YOU:   Understand these instructions.   Will watch your condition.   Will get help right away if you are not doing well or get worse.  Document Released: 03/18/2005 Document Revised: 03/07/2011 Document Reviewed: 06/18/2010 [facility] Patient Information 2012 [place], [place].Urinary Tract Infection Infections of the urinary tract can start in several places. A bladder infection (cystitis), a kidney infection (pyelonephritis), and a prostate infection (prostatitis) are different types of urinary tract infections (UTIs). They usually get better if treated with medicines (antibiotics) that kill germs. Take all the medicine until it is gone. You or your child may feel better in a few days, but TAKE ALL MEDICINE or the infection may not respond  and may become more difficult to treat. HOME CARE INSTRUCTIONS   Drink enough water and fluids to keep the urine clear or pale yellow. Cranberry juice is especially recommended, in addition to large amounts of water.   Avoid caffeine, tea, and carbonated beverages. They tend to irritate the bladder.   Alcohol may irritate the prostate.   Only take over-the-counter or prescription medicines for pain, discomfort, or fever as directed by your caregiver.  To prevent further infections:  Empty the bladder often. Avoid holding  urine for long periods of time.   After a bowel movement, women should cleanse from front to back. Use each tissue only once.   Empty the bladder before and after sexual intercourse.  FINDING OUT THE RESULTS OF YOUR TEST Not all test results are available during your visit. If your or your child's test results are not back during the visit, make an appointment with your caregiver to find out the results. Do not assume everything is normal if you have not heard from your caregiver or the medical facility. It is important for you to follow up on all test results. SEEK MEDICAL CARE IF:   There is back pain.   Your baby is older than 3 months with a rectal temperature of 100.5 F (38.1 C) or higher for more than 1 day.   Your or your child's problems (symptoms) are no better in 3 days. Return sooner if you or your child is getting worse.  SEEK IMMEDIATE MEDICAL CARE IF:   There is severe back pain or lower abdominal pain.   You or your child develops chills.   You have a fever.   Your baby is older than 3 months with a rectal temperature of 102 F (38.9 C) or higher.   Your baby is 81 months old or younger with a rectal temperature of 100.4 F (38 C) or higher.   There is nausea or vomiting.   There is continued burning or discomfort with urination.  MAKE SURE YOU:   Understand these instructions.   Will watch your condition.   Will get help right away if you are not doing well or get worse.  Document Released: 12/26/2004 Document Revised: 03/07/2011 Document Reviewed: 07/31/2006 [facility] Patient Information 2012 [place], [place].

## 2011-07-24 NOTE — ED Provider Notes (Signed)
History     CSN: [id number]  Arrival date & time 07/24/11  4098   First MD Initiated Contact with Patient 07/24/11 [phone]      Chief Complaint  Patient presents with  . Nausea  . Emesis    (Consider location/radiation/quality/duration/timing/severity/associated sxs/prior treatment) Patient is a 21 y.o. female presenting with vomiting. The history is provided by the patient.  Emesis    patient with vomiting secondary to hyperemesis. She is currently [redacted] weeks pregnant. No abdominal pain. No fever or diarrhea. Denies any urinary symptoms. No prenatal care as of yet. Denies any vaginal bleeding or discharge. She is not taking any medications currently. Vomiting has been nonbilious. No hematemesis or  Past Medical History  Diagnosis Date  . Anemia     Past Surgical History  Procedure Date  . Cesarean section     Family History  Problem Relation Age of Onset  . Cancer Maternal Aunt     History  Substance Use Topics  . Smoking status: Former Smoker -- 1.0 packs/day    Types: Cigarettes    Quit date: 07/12/2011  . Smokeless tobacco: Not on file  . Alcohol Use: No    OB History    Grav Para Term Preterm Abortions TAB SAB Ect Mult Living   2 1 1       1       Review of Systems  Gastrointestinal: Positive for vomiting.  All other systems reviewed and are negative.    Allergies  Review of patient's allergies indicates no known allergies.  Home Medications   Current Outpatient Rx  Name Route Sig Dispense Refill  . PRENATAL COMPLETE 14-0.4 MG PO TABS Oral Take 1 tablet by mouth daily. 60 each 5  . PROMETHAZINE HCL 25 MG RE SUPP Rectal Place 1 suppository (25 mg total) rectally every 6 (six) hours as needed for nausea. May be used vaginally 12 each 2    BP 119/77  Pulse 98  Temp(Src) 98.1 F (36.7 C) (Oral)  Resp 18  SpO2 100%  LMP 05/30/2011  Physical Exam  Nursing note and vitals reviewed. Constitutional: She is oriented to person, place, and time. She  appears well-developed and well-nourished.  Non-toxic appearance. No distress.  HENT:  Head: Normocephalic and atraumatic.  Eyes: Conjunctivae, EOM and lids are normal. Pupils are equal, round, and reactive to light.  Neck: Normal range of motion. Neck supple. No tracheal deviation present. No mass present.  Cardiovascular: Normal rate, regular rhythm and normal heart sounds.  Exam reveals no gallop.   No murmur heard. Pulmonary/Chest: Effort normal and breath sounds normal. No stridor. No respiratory distress. She has no decreased breath sounds. She has no wheezes. She has no rhonchi. She has no rales.  Abdominal: Soft. Normal appearance and bowel sounds are normal. She exhibits no distension. There is no tenderness. There is no rebound and no CVA tenderness.  Musculoskeletal: Normal range of motion. She exhibits no edema and no tenderness.  Neurological: She is alert and oriented to person, place, and time. She has normal strength. No cranial nerve deficit or sensory deficit. GCS eye subscore is 4. GCS verbal subscore is 5. GCS motor subscore is 6.  Skin: Skin is warm and dry. No abrasion and no rash noted.  Psychiatric: She has a normal mood and affect. Her speech is normal and behavior is normal.    ED Course  Procedures (including critical care time)   Labs Reviewed  CBC  DIFFERENTIAL  LIPASE, BLOOD  COMPREHENSIVE  METABOLIC PANEL  URINALYSIS, ROUTINE W REFLEX MICROSCOPIC  URINE CULTURE   No results found.   No diagnosis found.    MDM  Pt given iv fluids and antiemetics--emesis resolved--will f/u dr. Peri Jefferson, MD 07/24/11 1054

## 2011-07-24 NOTE — ED Notes (Signed)
Per pt report N/V x 3 days and 1 episode of diarrhea. Pt is [redacted]weeks pregnant, has a 21 year old child. C/o abd pain "due to throwing up"

## 2011-07-24 NOTE — ED Notes (Signed)
ZOX:[id number]<AV> Expected date:07/24/11<BR> Expected time: 8:08 AM<BR> Means of arrival:Ambulance<BR> Comments:<BR> 20yo n/v/d-[redacted]wks pregnant

## 2011-07-25 LAB — URINE CULTURE

## 2011-07-30 ENCOUNTER — Inpatient Hospital Stay (HOSPITAL_COMMUNITY)
Admission: AD | Admit: 2011-07-30 | Discharge: 2011-07-30 | Disposition: A | Discharge status: Home / Self Care | Payer: Medicaid Other | Source: Ambulatory Visit | Attending: Obstetrics | Admitting: Obstetrics

## 2011-07-30 ENCOUNTER — Encounter (HOSPITAL_COMMUNITY): Payer: Self-pay

## 2011-07-30 DIAGNOSIS — O210 Mild hyperemesis gravidarum: Secondary | ICD-10-CM | POA: Insufficient documentation

## 2011-07-30 DIAGNOSIS — O211 Hyperemesis gravidarum with metabolic disturbance: Secondary | ICD-10-CM

## 2011-07-30 LAB — URINALYSIS, ROUTINE W REFLEX MICROSCOPIC
Bilirubin Urine: NEGATIVE
Ketones, ur: 15 mg/dL — AB
Nitrite: NEGATIVE
Protein, ur: 30 mg/dL — AB
Urobilinogen, UA: 0.2 mg/dL (ref 0.0–1.0)
pH: 6 (ref 5.0–8.0)

## 2011-07-30 LAB — URINE MICROSCOPIC-ADD ON

## 2011-07-30 MED ORDER — M.V.I. ADULT IV INJ
10.0000 mL | Freq: Once | INTRAVENOUS | Status: AC
  Administered 2011-07-30: 10 mL via INTRAVENOUS
  Filled 2011-07-30: qty 10

## 2011-07-30 MED ORDER — SODIUM CHLORIDE 0.9 % IV SOLN
25.0000 mg | Freq: Once | INTRAVENOUS | Status: AC
  Administered 2011-07-30: 25 mg via INTRAVENOUS
  Filled 2011-07-30: qty 1

## 2011-07-30 MED ORDER — ONDANSETRON 8 MG PO TBDP
8.0000 mg | ORAL_TABLET | Freq: Once | ORAL | Status: AC
  Administered 2011-07-30: 8 mg via ORAL
  Filled 2011-07-30: qty 1

## 2011-07-30 NOTE — MAU Note (Signed)
Patient states she has been nauseated and vomiting for a while. Has been taking Zofran but stopped working yesterday. Unable to keep anything down. Denies any pain or bleeding.

## 2011-07-30 NOTE — MAU Provider Note (Signed)
Emily 46 y.o.G2P1001 @[redacted]w[redacted]d  by LMP Chief Complaint  Patient presents with  . Emesis During Pregnancy     First Provider Initiated Contact with Patient 07/30/11 0911      SUBJECTIVE  HPI: Exacerbation of hyperemesis. Takibng Zorfan ODT, no longer working. Vomiting/wretching every twenty minutes. Denies fever, chills, diarrhea, VB or abd pain  Past Medical History  Diagnosis Date  . Anemia    Past Surgical History  Procedure Date  . Cesarean section    History   Social History  . Marital Status: Single    Spouse Name: N/A    Number of Children: N/A  . Years of Education: N/A   Occupational History  . Not on file.   Social History Main Topics  . Smoking status: Former Smoker -- 1.0 packs/day    Types: Cigarettes    Quit date: 07/12/2011  . Smokeless tobacco: Not on file  . Alcohol Use: No  . Drug Use: No  . Sexually Active: Not Currently   Other Topics Concern  . Not on file   Social History Narrative  . No narrative on file   No current facility-administered medications on file prior to encounter.   Current Outpatient Prescriptions on File Prior to Encounter  Medication Sig Dispense Refill  . cephALEXin (KEFLEX) 500 MG capsule Take 1 capsule (500 mg total) by mouth 4 (four) times daily.  20 capsule  0  . ondansetron (ZOFRAN ODT) 8 MG disintegrating tablet Take 1 tablet (8 mg total) by mouth every 8 (eight) hours as needed for nausea.  20 tablet  0  . Prenatal Vit-Fe Fumarate-FA (PRENATAL COMPLETE) 14-0.4 MG TABS Take 1 tablet by mouth daily.  60 each  5   No Known Allergies  ROS: Pertinent items in HPI  OBJECTIVE Blood pressure 109/61, pulse 97, temperature 97.8 F (36.6 C), temperature source Oral, resp. rate 18, height 5\' 7"  (1.702 m), weight 48.081 kg (106 lb), last menstrual period 05/30/2011, SpO2 100.00%.  GENERAL: Well-developed, slender female in no acute distress. Lethargic.  HEENT: Normocephalic, good dentition, Mucus membranes dry HEART:  normal rate RESP: normal effort ABDOMEN: Soft, nontender EXTREMITIES: Nontender, no edema NEURO: Alert and oriented SPECULUM EXAM:Deferred  LAB RESULTS Results for orders placed during the hospital encounter of 07/30/11 (from the past 24 hour(s))  URINALYSIS, ROUTINE W REFLEX MICROSCOPIC     Status: Abnormal   Collection Time   07/30/11  8:05 AM      Component Value Range   Color, Urine YELLOW  YELLOW    APPearance CLEAR  CLEAR    Specific Gravity, Urine >1.030 (*) 1.005 - 1.030    pH 6.0  5.0 - 8.0    Glucose, UA NEGATIVE  NEGATIVE (mg/dL)   Hgb urine dipstick SMALL (*) NEGATIVE    Bilirubin Urine NEGATIVE  NEGATIVE    Ketones, ur 15 (*) NEGATIVE (mg/dL)   Protein, ur 30 (*) NEGATIVE (mg/dL)   Urobilinogen, UA 0.2  0.0 - 1.0 (mg/dL)   Nitrite NEGATIVE  NEGATIVE    Leukocytes, UA TRACE (*) NEGATIVE   URINE MICROSCOPIC-ADD ON     Status: Abnormal   Collection Time   07/30/11  8:05 AM      Component Value Range   Squamous Epithelial / LPF MANY (*) RARE    WBC, UA TOO NUMEROUS TO COUNT  <3 (WBC/hpf)   RBC / HPF 3-6  <3 (RBC/hpf)   Bacteria, UA MANY (*) RARE    Urine-Other MUCOUS PRESENT  IMAGING   ASSESSMENT No diagnosis found. Pt given IVF with phenergan with relief of symptoms.  When pt was given water, she started vomiting again.  Zofran ODT given and pt felt great and tolerating PO fluids and crackers.  PLAN Discharge home Prescriptions for Zofran and phenergan Diet for hyperemesis F/u with Dr. Clearance Coots Urine culture- pending     Dorathy Kinsman 07/30/2011 11:59 AM

## 2011-07-30 NOTE — Discharge Instructions (Signed)
Hyperemesis Gravidarum Diet Hyperemesis gravidarum is a severe form of morning sickness. It is characterized by frequent and severe vomiting. It happens during the first trimester of pregnancy. It may be caused by the rapid hormone changes that happen during pregnancy. It is associated with a 5% weight loss of pre-pregnancy weight. The hyperemesis diet may be used to lessen symptoms of nausea and vomiting. EATING GUIDELINES  Eat 5 to 6 small meals daily instead of 3 large meals.   Avoid foods with strong smells.   Avoid drinking 30 minutes before and after meals.   Avoid fried or high-fat foods, such as butter and cream sauces.   Starchy foods are usually well-tolerated, such as cereal, toast, bread, potatoes, pasta, rice, and pretzels.   Eat crackers before you get out of bed in the morning.   Avoid spicy foods.   Ginger may help with nausea. Add  tsp ginger to hot tea or choose ginger tea.   Continue to take your prenatal vitamins as directed by your caregiver.  SAMPLE MEAL PLAN Breakfast    cup oatmeal   1 slice toast   1 tsp heart-healthy margarine   1 tsp jelly   1 scrambled egg  Midmorning Snack   1 cup low-fat yogurt  Lunch   Plain ham sandwich   Carrot or celery sticks   1 small apple   3 graham crackers  Midafternoon Snack   Cheese and crackers  Dinner  4 oz pork tenderloin   1 small baked potato   1 tsp margarine    cup broccoli    cup grapes  Evening Snack  1 cup pudding  Document Released: 01/13/2007 Document Revised: 03/07/2011 Document Reviewed: 07/13/2010 Exit[employer] Patient Information 2012 [employer]. 

## 2011-08-05 ENCOUNTER — Encounter (HOSPITAL_COMMUNITY): Payer: Self-pay | Admitting: *Deleted

## 2011-08-05 ENCOUNTER — Inpatient Hospital Stay (HOSPITAL_COMMUNITY)
Admission: AD | Admit: 2011-08-05 | Discharge: 2011-08-05 | Disposition: A | Discharge status: Home / Self Care | Payer: Medicaid Other | Source: Ambulatory Visit | Attending: Obstetrics & Gynecology | Admitting: Obstetrics & Gynecology

## 2011-08-05 DIAGNOSIS — O210 Mild hyperemesis gravidarum: Secondary | ICD-10-CM | POA: Insufficient documentation

## 2011-08-05 DIAGNOSIS — R1115 Cyclical vomiting syndrome unrelated to migraine: Secondary | ICD-10-CM

## 2011-08-05 DIAGNOSIS — R1110 Vomiting, unspecified: Secondary | ICD-10-CM

## 2011-08-05 LAB — URINALYSIS, ROUTINE W REFLEX MICROSCOPIC
Glucose, UA: NEGATIVE mg/dL
Protein, ur: NEGATIVE mg/dL
Specific Gravity, Urine: 1.02 (ref 1.005–1.030)

## 2011-08-05 LAB — URINE MICROSCOPIC-ADD ON

## 2011-08-05 MED ORDER — DEXTROSE 5 % IN LACTATED RINGERS IV BOLUS
1000.0000 mL | Freq: Once | INTRAVENOUS | Status: AC
  Administered 2011-08-05: 1000 mL via INTRAVENOUS

## 2011-08-05 MED ORDER — ONDANSETRON HCL 4 MG/2ML IJ SOLN
4.0000 mg | Freq: Once | INTRAMUSCULAR | Status: AC
  Administered 2011-08-05: 4 mg via INTRAVENOUS
  Filled 2011-08-05: qty 2

## 2011-08-05 NOTE — Discharge Instructions (Signed)
Hyperemesis Gravidarum  Hyperemesis gravidarum is a severe form of nausea and vomiting that happens during pregnancy. Hyperemesis is worse than morning sickness. It may cause a woman to have nausea or vomiting all day for many days. It may keep a woman from eating and drinking enough food and liquids. Hyperemesis usually occurs during the first half (the first 20 weeks) of pregnancy. It often goes away once a woman is in her second half of pregnancy. However, sometimes hyperemesis continues through an entire pregnancy.   CAUSES   The cause of this condition is not completely known but is thought to be due to changes in the body's hormones when pregnant. It could be the high level of the pregnancy hormone or an increase in estrogen in the body.   SYMPTOMS    Severe nausea and vomiting.   Nausea that does not go away.   Vomiting that does not allow you to keep any food down.   Weight loss and body fluid loss (dehydration).   Having no desire to eat or not liking food you have previously enjoyed.  DIAGNOSIS   Your caregiver may ask you about your symptoms. Your caregiver may also order blood tests and urine tests to make sure something else is not causing the problem.   TREATMENT   You may only need medicine to control the problem. If medicines do not control the nausea and vomiting, you will be treated in the hospital to prevent dehydration, acidosis, weight loss, and changes in the electrolytes in your body that may harm the unborn baby (fetus). You may need intravenous (IV) fluids.   HOME CARE INSTRUCTIONS    Take all medicine as directed by your caregiver.   Try eating a couple of dry crackers or toast in the morning before getting out of bed.   Avoid foods and smells that upset your stomach.   Avoid fatty and spicy foods. Eat 5 to 6 small meals a day.   Do not drink when eating meals. Drink between meals.   For snacks, eat high protein foods, such as cheese. Eat or suck on things that have ginger in  them. Ginger helps nausea.   Avoid food preparation. The smell of food can spoil your appetite.   Avoid iron pills and iron in your multivitamins until after 3 to 4 months of being pregnant.  SEEK MEDICAL CARE IF:    Your abdominal pain increases since the last time you saw your caregiver.   You have a severe headache.   You develop vision problems.   You feel you are losing weight.  SEEK IMMEDIATE MEDICAL CARE IF:    You are unable to keep fluids down.   You vomit blood.   You have constant nausea and vomiting.   You have a fever.   You have excessive weakness, dizziness, fainting, or extreme thirst.  MAKE SURE YOU:    Understand these instructions.   Will watch your condition.   Will get help right away if you are not doing well or get worse.  Document Released: 03/18/2005 Document Revised: 03/07/2011 Document Reviewed: 06/18/2010  ExitCare Patient Information 2012 [employer].

## 2011-08-05 NOTE — MAU Provider Note (Signed)
History     CSN: [id number]  Arrival date and time: 08/05/11 0865   First Provider Initiated Contact with Patient 08/05/11 1919      Chief Complaint  Patient presents with  . Emesis During Pregnancy  . Headache   HPI  Pt is [redacted]w[redacted]d pregnant and has been in [facility] for multiple occ for hyperemesis.  She has been taking Zofran with some relief until the 6th day and then has recurrence of hyperemesis.  Pt has not established care with Dr. Clearance Coots due to missed appointments. He delivered her child 3 years ago. She last took Zofran yesterday and forgot it on the way to work.  She worked from 1 pm until 5:30pm.  She last ate 1/2 banana rice and cabbage and 1/2 pizza roll this morning.  She has been able to drink.  When she went to work and started feeling bad and called EMS..    Past Medical History  Diagnosis Date  . Anemia     Past Surgical History  Procedure Date  . Cesarean section     Family History  Problem Relation Age of Onset  . Cancer Maternal Aunt   . Anesthesia problems Neg Hx   . Hypotension Neg Hx   . Malignant hyperthermia Neg Hx   . Pseudochol deficiency Neg Hx     History  Substance Use Topics  . Smoking status: Former Smoker -- 1.0 packs/day    Types: Cigarettes    Quit date: 07/12/2011  . Smokeless tobacco: Not on file  . Alcohol Use: No    Allergies: No Known Allergies  Prescriptions prior to admission  Medication Sig Dispense Refill  . Prenatal Vit-Fe Fumarate-FA (PRENATAL COMPLETE) 14-0.4 MG TABS Take 1 tablet by mouth daily.  60 each  5    Review of Systems  Constitutional: Negative for fever and chills.  Gastrointestinal: Positive for nausea and vomiting. Negative for abdominal pain, diarrhea and constipation.  Genitourinary: Negative for dysuria and urgency.  Neurological: Negative for headaches.   Physical Exam   Blood pressure 120/70, pulse 109, temperature 97.8 F (36.6 C), temperature source Oral, resp. rate 16, height 5\' 7"  (1.702 m), last  menstrual period 05/30/2011, SpO2 100.00%.  Physical Exam  Nursing note and vitals reviewed. Constitutional: She is oriented to person, place, and time. She appears well-developed and well-nourished.  HENT:  Head: Normocephalic.  Eyes: Pupils are equal, round, and reactive to light.  Neck: Normal range of motion. Neck supple.  Cardiovascular: Normal rate.   Respiratory: Effort normal.  GI: Soft.  Musculoskeletal: Normal range of motion.  Neurological: She is alert and oriented to person, place, and time. Abnormal muscle tone: somewhat withdrawn.  Skin: Skin is warm and dry.  Psychiatric: She has a normal mood and affect.    MAU Course  Procedures IVF D5LR with Zofran 4mg  IV Results for orders placed during the hospital encounter of 08/05/11 (from the past 24 hour(s))  URINALYSIS, ROUTINE W REFLEX MICROSCOPIC     Status: Abnormal   Collection Time   08/05/11  7:55 PM      Component Value Range   Color, Urine YELLOW  YELLOW    APPearance CLOUDY (*) CLEAR    Specific Gravity, Urine 1.020  1.005 - 1.030    pH 7.0  5.0 - 8.0    Glucose, UA NEGATIVE  NEGATIVE (mg/dL)   Hgb urine dipstick NEGATIVE  NEGATIVE    Bilirubin Urine NEGATIVE  NEGATIVE    Ketones, ur 40 (*) NEGATIVE (  mg/dL)   Protein, ur NEGATIVE  NEGATIVE (mg/dL)   Urobilinogen, UA 0.2  0.0 - 1.0 (mg/dL)   Nitrite NEGATIVE  NEGATIVE    Leukocytes, UA MODERATE (*) NEGATIVE   URINE MICROSCOPIC-ADD ON     Status: Abnormal   Collection Time   08/05/11  7:55 PM      Component Value Range   Squamous Epithelial / LPF FEW (*) RARE    WBC, UA 11-20  <3 (WBC/hpf)   Bacteria, UA MANY (*) RARE   urine culture ordered Care transferred to Wynelle Bourgeois, CNM IV finished. Tolerating sprite. Will d/c home. Has zofran at home  Assessment and Plan  A:  SIUP at [redacted]w[redacted]d       Hyperemesis, noncompliant with medication regime P:  Discharge home      Encourage to use medication     Encouraged to seek [facility]  LINEBERRY,SUSAN 08/05/2011, 7:21  PM

## 2011-08-05 NOTE — MAU Note (Signed)
Patient states she has Zofran at home that has been working. Did not take it with her to work and has had multiple episodes of vomiting today. Has a bad headache. Arrived by EMS.

## 2011-08-06 LAB — URINE CULTURE: Culture: NO GROWTH

## 2011-08-14 ENCOUNTER — Inpatient Hospital Stay (HOSPITAL_COMMUNITY)
Admission: AD | Admit: 2011-08-14 | Discharge: 2011-08-14 | Disposition: A | Discharge status: Home / Self Care | Payer: Medicaid Other | Source: Ambulatory Visit | Attending: Obstetrics & Gynecology | Admitting: Obstetrics & Gynecology

## 2011-08-14 ENCOUNTER — Encounter (HOSPITAL_COMMUNITY): Payer: Self-pay | Admitting: *Deleted

## 2011-08-14 DIAGNOSIS — O210 Mild hyperemesis gravidarum: Secondary | ICD-10-CM | POA: Diagnosis present

## 2011-08-14 DIAGNOSIS — R112 Nausea with vomiting, unspecified: Secondary | ICD-10-CM

## 2011-08-14 LAB — URINALYSIS, ROUTINE W REFLEX MICROSCOPIC
Nitrite: NEGATIVE
Specific Gravity, Urine: 1.01 (ref 1.005–1.030)
Urobilinogen, UA: 1 mg/dL (ref 0.0–1.0)
pH: 8.5 — ABNORMAL HIGH (ref 5.0–8.0)

## 2011-08-14 LAB — URINE MICROSCOPIC-ADD ON

## 2011-08-14 MED ORDER — PROMETHAZINE HCL 25 MG PO TABS
12.5000 mg | ORAL_TABLET | ORAL | Status: AC
  Administered 2011-08-14: 12.5 mg via ORAL
  Filled 2011-08-14: qty 1

## 2011-08-14 MED ORDER — PROMETHAZINE HCL 25 MG RE SUPP: 25.0000 mg | Freq: Four times a day (QID) | RECTAL | Status: AC | PRN

## 2011-08-14 MED ORDER — DEXTROSE 5 % IN LACTATED RINGERS IV BOLUS
1000.0000 mL | Freq: Once | INTRAVENOUS | Status: AC
  Administered 2011-08-14: 1000 mL via INTRAVENOUS

## 2011-08-14 MED ORDER — PROMETHAZINE HCL 12.5 MG PO TABS: 12.5000 mg | ORAL_TABLET | Freq: Four times a day (QID) | ORAL | Status: AC | PRN

## 2011-08-14 NOTE — MAU Note (Signed)
Pt states she ran out of zofran yesterday.  Started vomiting last night.  Reports headaches since finding out about pregnancy.  Had an appt at Dr Thomes Lolling office, but it was at 2 , states she couldn't handle the vomiting so she called EMS to come here instead.  Denies any abdominal pain, bleeding, or discharge.

## 2011-08-14 NOTE — MAU Note (Signed)
Pt reports having vomiting since last night. Ran out of zofran.

## 2011-08-14 NOTE — Discharge Instructions (Signed)
Hyperemesis Gravidarum  Hyperemesis gravidarum is a severe form of nausea and vomiting that happens during pregnancy. Hyperemesis is worse than morning sickness. It may cause a woman to have nausea or vomiting all day for many days. It may keep a woman from eating and drinking enough food and liquids. Hyperemesis usually occurs during the first half (the first 20 weeks) of pregnancy. It often goes away once a woman is in her second half of pregnancy. However, sometimes hyperemesis continues through an entire pregnancy.   CAUSES   The cause of this condition is not completely known but is thought to be due to changes in the body's hormones when pregnant. It could be the high level of the pregnancy hormone or an increase in estrogen in the body.   SYMPTOMS    Severe nausea and vomiting.   Nausea that does not go away.   Vomiting that does not allow you to keep any food down.   Weight loss and body fluid loss (dehydration).   Having no desire to eat or not liking food you have previously enjoyed.  DIAGNOSIS   Your caregiver may ask you about your symptoms. Your caregiver may also order blood tests and urine tests to make sure something else is not causing the problem.   TREATMENT   You may only need medicine to control the problem. If medicines do not control the nausea and vomiting, you will be treated in the hospital to prevent dehydration, acidosis, weight loss, and changes in the electrolytes in your body that may harm the unborn baby (fetus). You may need intravenous (IV) fluids.   HOME CARE INSTRUCTIONS    Take all medicine as directed by your caregiver.   Try eating a couple of dry crackers or toast in the morning before getting out of bed.   Avoid foods and smells that upset your stomach.   Avoid fatty and spicy foods. Eat 5 to 6 small meals a day.   Do not drink when eating meals. Drink between meals.   For snacks, eat high protein foods, such as cheese. Eat or suck on things that have ginger in  them. Ginger helps nausea.   Avoid food preparation. The smell of food can spoil your appetite.   Avoid iron pills and iron in your multivitamins until after 3 to 4 months of being pregnant.  SEEK MEDICAL CARE IF:    Your abdominal pain increases since the last time you saw your caregiver.   You have a severe headache.   You develop vision problems.   You feel you are losing weight.  SEEK IMMEDIATE MEDICAL CARE IF:    You are unable to keep fluids down.   You vomit blood.   You have constant nausea and vomiting.   You have a fever.   You have excessive weakness, dizziness, fainting, or extreme thirst.  MAKE SURE YOU:    Understand these instructions.   Will watch your condition.   Will get help right away if you are not doing well or get worse.  Document Released: 03/18/2005 Document Revised: 03/07/2011 Document Reviewed: 06/18/2010  ExitCare Patient Information 2012 [employer].

## 2011-08-14 NOTE — MAU Provider Note (Signed)
Emily 15 y.o.G2P1001 @[redacted]w[redacted]d  by LMP Chief Complaint  Patient presents with  . Emesis During Pregnancy     First Provider Initiated Contact with Patient 08/14/11 1455      SUBJECTIVE  HPI: Pt presented to MAU via EMS for n/v and she ran out of Zofran prescription.  She had initial prenatal appt at Dr Verdell Carmine office today but felt so bad she came to MAU instead.  She reports vomiting every time she tries to eat anything and has kept down some fluid, but no food for 2 days.  She has a mild h/a.  She vomited x1, yellow liquid emesis, since arrival in MAU.  She reports losing weight since beginning of pregnancy.  She received a prescription for phenergan in MAU a few weeks ago but never picked it up and lost the prescription.  She denies abdominal or back pain, LOF, vaginal bleeding, vaginal itching/burning, urinary symptoms, h/a, dizziness, n/v, or fever/chills.    Past Medical History  Diagnosis Date  . Anemia    Past Surgical History  Procedure Date  . Cesarean section    History   Social History  . Marital Status: Single    Spouse Name: N/A    Number of Children: N/A  . Years of Education: N/A   Occupational History  . Not on file.   Social History Main Topics  . Smoking status: Former Smoker -- 1.0 packs/day    Types: Cigarettes    Quit date: 07/12/2011  . Smokeless tobacco: Not on file  . Alcohol Use: No  . Drug Use: No  . Sexually Active: Not Currently   Other Topics Concern  . Not on file   Social History Narrative  . No narrative on file   No current facility-administered medications on file prior to encounter.   Current Outpatient Prescriptions on File Prior to Encounter  Medication Sig Dispense Refill  . Prenatal Vit-Fe Fumarate-FA (PRENATAL COMPLETE) 14-0.4 MG TABS Take 1 tablet by mouth daily.  60 each  5   No Known Allergies  ROS: Pertinent items in HPI  OBJECTIVE Blood pressure 121/65, pulse 101, temperature 97.6 F (36.4 C), temperature  source Oral, resp. rate 18, height 5\' 7"  (1.702 m), weight 47.9 kg (105 lb 9.6 oz), last menstrual period 05/30/2011.  GENERAL: Well-developed, thin pt, appearing uncomfortable upon arrival HEENT: Normocephalic, good dentition HEART: normal rate RESP: normal effort ABDOMEN: Soft, nontender EXTREMITIES: Nontender, no edema NEURO: Alert and oriented SPECULUM EXAM: Deferred  LAB RESULTS Results for orders placed during the hospital encounter of 08/14/11 (from the past 24 hour(s))  URINALYSIS, ROUTINE W REFLEX MICROSCOPIC     Status: Abnormal   Collection Time   08/14/11  2:05 PM      Component Value Range   Color, Urine YELLOW  YELLOW    APPearance CLOUDY (*) CLEAR    Specific Gravity, Urine 1.010  1.005 - 1.030    pH 8.5 (*) 5.0 - 8.0    Glucose, UA NEGATIVE  NEGATIVE (mg/dL)   Hgb urine dipstick NEGATIVE  NEGATIVE    Bilirubin Urine NEGATIVE  NEGATIVE    Ketones, ur 40 (*) NEGATIVE (mg/dL)   Protein, ur NEGATIVE  NEGATIVE (mg/dL)   Urobilinogen, UA 1.0  0.0 - 1.0 (mg/dL)   Nitrite NEGATIVE  NEGATIVE    Leukocytes, UA SMALL (*) NEGATIVE   URINE MICROSCOPIC-ADD ON     Status: Abnormal   Collection Time   08/14/11  2:05 PM      Component Value  Range   Squamous Epithelial / LPF FEW (*) RARE    WBC, UA 21-50  <3 (WBC/hpf)   Bacteria, UA MANY (*) RARE      IMAGING Bedside sono performed and pt reassured by fetal movement and visual of FHR  ASSESSMENT IUP [redacted]w[redacted]d by LMP Hyperemesis gravidarium  PLAN D5 LR x1000 ml in MAU D/C home  Phenergan 12/5 mg PO Q 6 hours PRN n/v Phenergan 25 mg suppository Q 6 hours PRN n/v Pt to take PO tablets when able, use suppository as needed to keep food down Call Dr Clearance Coots to reschedule appointment she missed today Return to MAU as needed    LEFTWICH-KIRBY, Promyse Ardito 08/14/2011 3:57 PM

## 2011-09-26 ENCOUNTER — Encounter (HOSPITAL_COMMUNITY): Payer: Self-pay | Admitting: *Deleted

## 2011-09-26 ENCOUNTER — Inpatient Hospital Stay (HOSPITAL_COMMUNITY)
Admission: AD | Admit: 2011-09-26 | Discharge: 2011-09-26 | Disposition: A | Discharge status: Home / Self Care | Payer: Self-pay | Source: Ambulatory Visit | Attending: Obstetrics | Admitting: Obstetrics

## 2011-09-26 ENCOUNTER — Inpatient Hospital Stay (HOSPITAL_COMMUNITY): Payer: Self-pay

## 2011-09-26 DIAGNOSIS — A5901 Trichomonal vulvovaginitis: Secondary | ICD-10-CM | POA: Insufficient documentation

## 2011-09-26 DIAGNOSIS — A599 Trichomoniasis, unspecified: Secondary | ICD-10-CM

## 2011-09-26 DIAGNOSIS — N949 Unspecified condition associated with female genital organs and menstrual cycle: Secondary | ICD-10-CM

## 2011-09-26 DIAGNOSIS — O98819 Other maternal infectious and parasitic diseases complicating pregnancy, unspecified trimester: Secondary | ICD-10-CM | POA: Insufficient documentation

## 2011-09-26 DIAGNOSIS — R109 Unspecified abdominal pain: Secondary | ICD-10-CM | POA: Insufficient documentation

## 2011-09-26 HISTORY — DX: Chlamydial infection, unspecified: A74.9

## 2011-09-26 LAB — CBC WITH DIFFERENTIAL/PLATELET
Basophils Relative: 0 % (ref 0–1)
HCT: 32.4 % — ABNORMAL LOW (ref 36.0–46.0)
Hemoglobin: 10.4 g/dL — ABNORMAL LOW (ref 12.0–15.0)
Lymphocytes Relative: 27 % (ref 12–46)
Lymphs Abs: 1.5 10*3/uL (ref 0.7–4.0)
Monocytes Absolute: 0.5 10*3/uL (ref 0.1–1.0)
Monocytes Relative: 9 % (ref 3–12)
Neutro Abs: 3.4 10*3/uL (ref 1.7–7.7)
Neutrophils Relative %: 63 % (ref 43–77)
RBC: 3.8 MIL/uL — ABNORMAL LOW (ref 3.87–5.11)
WBC: 5.4 10*3/uL (ref 4.0–10.5)

## 2011-09-26 LAB — URINE MICROSCOPIC-ADD ON

## 2011-09-26 LAB — URINALYSIS, ROUTINE W REFLEX MICROSCOPIC
Glucose, UA: NEGATIVE mg/dL
Protein, ur: NEGATIVE mg/dL
Specific Gravity, Urine: 1.02 (ref 1.005–1.030)
pH: 6.5 (ref 5.0–8.0)

## 2011-09-26 LAB — WET PREP, GENITAL: Clue Cells Wet Prep HPF POC: NONE SEEN

## 2011-09-26 MED ORDER — METRONIDAZOLE 500 MG PO TABS: 500.0000 mg | ORAL_TABLET | Freq: Two times a day (BID) | ORAL | Status: AC

## 2011-09-26 NOTE — MAU Note (Signed)
Pt C/O right sided intermittant abd pain x 1 week.  Pt also states she also has lower right lumbar pain at night.  Denies any vaginal bleeding or ROM.

## 2011-09-26 NOTE — MAU Provider Note (Signed)
History     CSN: [id number]  Arrival date & time 09/26/11  1418   None     Chief Complaint  Patient presents with  . Abdominal Pain    HPI [name] is a 21 y.o. female @ 100w0d gestation who presents to MAU for abdominal pain. The pain started about a weeks ago. She describes the pain as sharp on the right side of the abdomen. The pain is worse at night after working all day. The pain is better with rest.  Past Medical History  Diagnosis Date  . Anemia   . Chlamydia 2012    Past Surgical History  Procedure Date  . Cesarean section   . Tooth extraction 2010    Family History  Problem Relation Age of Onset  . Cancer Maternal Aunt   . Anesthesia problems Neg Hx   . Hypotension Neg Hx   . Malignant hyperthermia Neg Hx   . Pseudochol deficiency Neg Hx   . Other Neg Hx     History  Substance Use Topics  . Smoking status: Former Smoker -- 1.0 packs/day    Types: Cigarettes    Quit date: 07/12/2011  . Smokeless tobacco: Never Used  . Alcohol Use: No    OB History    Grav Para Term Preterm Abortions TAB SAB Ect Mult Living   2 1 1       1       Review of Systems  Constitutional: Negative for fever, chills, diaphoresis and fatigue.  HENT: Negative for ear pain, congestion, sore throat, facial swelling, neck pain, neck stiffness, dental problem and sinus pressure.   Eyes: Negative for photophobia, pain and discharge.  Respiratory: Negative for cough, chest tightness and wheezing.   Gastrointestinal: Positive for abdominal pain. Negative for nausea, vomiting, diarrhea, constipation and abdominal distention.  Genitourinary: Negative for dysuria, frequency, flank pain, vaginal bleeding, vaginal discharge and difficulty urinating.  Musculoskeletal: Negative for myalgias, back pain and gait problem.  Skin: Negative for color change and rash.  Neurological: Negative for dizziness, speech difficulty, weakness, light-headedness, numbness and headaches.    Psychiatric/Behavioral: Negative for confusion and agitation.    Allergies  Review of patient's allergies indicates no known allergies.  Home Medications  No current outpatient prescriptions on file.  BP 86/47  Pulse 72  Temp 98.2 F (36.8 C) (Oral)  Resp 18  Ht 5\' 7"  (1.702 m)  Wt 105 lb (47.628 kg)  BMI 16.45 kg/m2  SpO2 100%  LMP 05/30/2011  Physical Exam  Nursing note and vitals reviewed. Constitutional: She is oriented to person, place, and time. She appears well-developed and well-nourished. No distress.  HENT:  Head: Normocephalic.  Eyes: EOM are normal.  Neck: Neck supple.  Cardiovascular: Normal rate.   Pulmonary/Chest: Effort normal.  Abdominal: Soft. There is tenderness.       Minimal tenderness right side of abdomen with palpation and ROM.  Genitourinary:       External genitalia without lesions. White discharge vaginal vault. Cervix closed, thick, high. Uterus consistent with dates.  Musculoskeletal: Normal range of motion.  Neurological: She is alert and oriented to person, place, and time. No cranial nerve deficit.  Skin: Skin is warm and dry.  Psychiatric: She has a normal mood and affect. Her behavior is normal. Judgment and thought content normal.   Results for orders placed during the hospital encounter of 09/26/11 (from the past 24 hour(s))  WET PREP, GENITAL     Status: Abnormal   Collection Time  09/26/11  3:30 PM      Component Value Range   Yeast Wet Prep HPF POC FEW (*) NONE SEEN   Trich, Wet Prep FEW (*) NONE SEEN   Clue Cells Wet Prep HPF POC NONE SEEN  NONE SEEN   WBC, Wet Prep HPF POC MANY (*) NONE SEEN  URINALYSIS, ROUTINE W REFLEX MICROSCOPIC     Status: Abnormal   Collection Time   09/26/11  3:30 PM      Component Value Range   Color, Urine YELLOW  YELLOW   APPearance HAZY (*) CLEAR   Specific Gravity, Urine 1.020  1.005 - 1.030   pH 6.5  5.0 - 8.0   Glucose, UA NEGATIVE  NEGATIVE mg/dL   Hgb urine dipstick NEGATIVE  NEGATIVE    Bilirubin Urine NEGATIVE  NEGATIVE   Ketones, ur NEGATIVE  NEGATIVE mg/dL   Protein, ur NEGATIVE  NEGATIVE mg/dL   Urobilinogen, UA 0.2  0.0 - 1.0 mg/dL   Nitrite NEGATIVE  NEGATIVE   Leukocytes, UA SMALL (*) NEGATIVE  URINE MICROSCOPIC-ADD ON     Status: Abnormal   Collection Time   09/26/11  3:30 PM      Component Value Range   Squamous Epithelial / LPF FEW (*) RARE   WBC, UA 0-2  <3 WBC/hpf   Bacteria, UA FEW (*) RARE   Urine-Other TRICHOMONAS PRESENT    CBC WITH DIFFERENTIAL     Status: Abnormal   Collection Time   09/26/11  3:40 PM      Component Value Range   WBC 5.4  4.0 - 10.5 K/uL   RBC 3.80 (*) 3.87 - 5.11 MIL/uL   Hemoglobin 10.4 (*) 12.0 - 15.0 g/dL   HCT 40.9 (*) 81.1 - 91.4 %   MCV 85.3  78.0 - 100.0 fL   MCH 27.4  26.0 - 34.0 pg   MCHC 32.1  30.0 - 36.0 g/dL   RDW 78.2  95.6 - 21.3 %   Platelets 177  150 - 400 K/uL   Neutrophils Relative 63  43 - 77 %   Neutro Abs 3.4  1.7 - 7.7 K/uL   Lymphocytes Relative 27  12 - 46 %   Lymphs Abs 1.5  0.7 - 4.0 K/uL   Monocytes Relative 9  3 - 12 %   Monocytes Absolute 0.5  0.1 - 1.0 K/uL   Eosinophils Relative 1  0 - 5 %   Eosinophils Absolute 0.1  0.0 - 0.7 K/uL   Basophils Relative 0  0 - 1 %   Basophils Absolute 0.0  0.0 - 0.1 K/uL  ABO/RH     Status: Normal (Preliminary result)   Collection Time   09/26/11  3:40 PM      Component Value Range   ABO/RH(D) O NEG    MISCELLANEOUS TEST     Status: Normal (Preliminary result)   Collection Time   09/26/11  3:40 PM      Component Value Range   Miscellaneous Test AFPQUAD      Miscellaneous Test Results PENDING     Ultrasound today shows 18 week 3 day IUP with cardiac activity at 143, placenta is posterior and above the cervical os. Cervical length  3.7 cm. Fluid normal. Echogenic intracardiac focus is seen and considered soft marker for Down's Syndrome.  Consult with Dr. Clearance Coots regarding ultrasound results and soft marker for Downs  Assessment: Round Ligament  pain   Trichomonas vaginosis  Plan:  Rx Flagyl  Will do Quad screen for further evaluation of possible downs   Patient to follow up with Dr. Clearance Coots and he will schedule an appointment with MFM. ED Course  Procedures  I have discussed with the patient in detail results of labs and ultrasound and need for follow up ASAP. Discussed need for partner treatment of trichomonas. Patient voices understanding. MDM

## 2011-09-26 NOTE — Discharge Instructions (Signed)
Follow up with Dr. Clearance Coots in the office as soon as possible. Return here as needed.  Trichomoniasis Trichomoniasis is an infection, caused by the Trichomonas organism, that affects both women and men. In women, the outer female genitalia and the vagina are affected. In men, the penis is mainly affected, but the prostate and other reproductive organs can also be involved. Trichomoniasis is a sexually transmitted disease (STD) and is most often passed to another person through sexual contact. The majority of people who get trichomoniasis do so from a sexual encounter and are also at risk for other STDs. CAUSES   Sexual intercourse with an infected partner.   It can be present in swimming pools or hot tubs.  SYMPTOMS   Abnormal gray-green frothy vaginal discharge in women.   Vaginal itching and irritation in women.   Itching and irritation of the area outside the vagina in women.   Penile discharge with or without pain in males.   Inflammation of the urethra (urethritis), causing painful urination.   Bleeding after sexual intercourse.  RELATED COMPLICATIONS  Pelvic inflammatory disease.   Infection of the uterus (endometritis).   Infertility.   Tubal (ectopic) pregnancy.   It can be associated with other STDs, including gonorrhea and chlamydia, hepatitis B, and HIV.  COMPLICATIONS DURING PREGNANCY  Early (premature) delivery.   Premature rupture of the membranes (PROM).   Low birth weight.  DIAGNOSIS   Visualization of Trichomonas under the microscope from the vagina discharge.   Ph of the vagina greater than 4.5, tested with a test tape.   Trich Rapid Test.   Culture of the organism, but this is not usually needed.   It may be found on a Pap test.   Having a "strawberry cervix,"which means the cervix looks very red like a strawberry.  TREATMENT   You may be given medication to fight the infection. Inform your caregiver if you could be or are pregnant. Some  medications used to treat the infection should not be taken during pregnancy.   Over-the-counter medications or creams to decrease itching or irritation may be recommended.   Your sexual partner will need to be treated if infected.  HOME CARE INSTRUCTIONS   Take all medication prescribed by your caregiver.   Take over-the-counter medication for itching or irritation as directed by your caregiver.   Do not have sexual intercourse while you have the infection.   Do not douche or wear tampons.   Discuss your infection with your partner, as your partner may have acquired the infection from you. Or, your partner may have been the person who transmitted the infection to you.   Have your sex partner examined and treated if necessary.   Practice safe, informed, and protected sex.   See your caregiver for other STD testing.  SEEK MEDICAL CARE IF:   You still have symptoms after you finish the medication.   You have an oral temperature above 102 F (38.9 C).   You develop belly (abdominal) pain.   You have pain when you urinate.   You have bleeding after sexual intercourse.   You develop a rash.   The medication makes you sick or makes you throw up (vomit).  Document Released: 09/11/2000 Document Revised: 03/07/2011 Document Reviewed: 10/07/2008 [facility] Patient Information 2012 [place], [place].Trichomoniasis Trichomoniasis is an infection, caused by the Trichomonas organism, that affects both women and men. In women, the outer female genitalia and the vagina are affected. In men, the penis is mainly affected, but  the prostate and other reproductive organs can also be involved. Trichomoniasis is a sexually transmitted disease (STD) and is most often passed to another person through sexual contact. The majority of people who get trichomoniasis do so from a sexual encounter and are also at risk for other STDs. CAUSES   Sexual intercourse with an infected partner.   It can be present  in swimming pools or hot tubs.  SYMPTOMS   Abnormal gray-green frothy vaginal discharge in women.   Vaginal itching and irritation in women.   Itching and irritation of the area outside the vagina in women.   Penile discharge with or without pain in males.   Inflammation of the urethra (urethritis), causing painful urination.   Bleeding after sexual intercourse.  RELATED COMPLICATIONS  Pelvic inflammatory disease.   Infection of the uterus (endometritis).   Infertility.   Tubal (ectopic) pregnancy.   It can be associated with other STDs, including gonorrhea and chlamydia, hepatitis B, and HIV.  COMPLICATIONS DURING PREGNANCY  Early (premature) delivery.   Premature rupture of the membranes (PROM).   Low birth weight.  DIAGNOSIS   Visualization of Trichomonas under the microscope from the vagina discharge.   Ph of the vagina greater than 4.5, tested with a test tape.   Trich Rapid Test.   Culture of the organism, but this is not usually needed.   It may be found on a Pap test.   Having a "strawberry cervix,"which means the cervix looks very red like a strawberry.  TREATMENT   You may be given medication to fight the infection. Inform your caregiver if you could be or are pregnant. Some medications used to treat the infection should not be taken during pregnancy.   Over-the-counter medications or creams to decrease itching or irritation may be recommended.   Your sexual partner will need to be treated if infected.  HOME CARE INSTRUCTIONS   Take all medication prescribed by your caregiver.   Take over-the-counter medication for itching or irritation as directed by your caregiver.   Do not have sexual intercourse while you have the infection.   Do not douche or wear tampons.   Discuss your infection with your partner, as your partner may have acquired the infection from you. Or, your partner may have been the person who transmitted the infection to you.    Have your sex partner examined and treated if necessary.   Practice safe, informed, and protected sex.   See your caregiver for other STD testing.  SEEK MEDICAL CARE IF:   You still have symptoms after you finish the medication.   You have an oral temperature above 102 F (38.9 C).   You develop belly (abdominal) pain.   You have pain when you urinate.   You have bleeding after sexual intercourse.   You develop a rash.   The medication makes you sick or makes you throw up (vomit).  Document Released: 09/11/2000 Document Revised: 03/07/2011 Document Reviewed: 10/07/2008 [facility] Patient Information 2012 [place], [place].

## 2012-01-31 ENCOUNTER — Inpatient Hospital Stay (HOSPITAL_COMMUNITY)
Admission: AD | Admit: 2012-01-31 | Discharge: 2012-01-31 | Disposition: A | Discharge status: Home / Self Care | Payer: Medicaid Other | Source: Ambulatory Visit | Attending: Obstetrics & Gynecology | Admitting: Obstetrics & Gynecology

## 2012-01-31 ENCOUNTER — Encounter (HOSPITAL_COMMUNITY): Payer: Self-pay | Admitting: *Deleted

## 2012-01-31 DIAGNOSIS — O98819 Other maternal infectious and parasitic diseases complicating pregnancy, unspecified trimester: Secondary | ICD-10-CM | POA: Insufficient documentation

## 2012-01-31 DIAGNOSIS — A5901 Trichomonal vulvovaginitis: Secondary | ICD-10-CM | POA: Insufficient documentation

## 2012-01-31 DIAGNOSIS — B373 Candidiasis of vulva and vagina: Secondary | ICD-10-CM | POA: Diagnosis present

## 2012-01-31 DIAGNOSIS — B3731 Acute candidiasis of vulva and vagina: Secondary | ICD-10-CM | POA: Insufficient documentation

## 2012-01-31 DIAGNOSIS — O0930 Supervision of pregnancy with insufficient antenatal care, unspecified trimester: Secondary | ICD-10-CM | POA: Insufficient documentation

## 2012-01-31 DIAGNOSIS — N949 Unspecified condition associated with female genital organs and menstrual cycle: Secondary | ICD-10-CM | POA: Insufficient documentation

## 2012-01-31 DIAGNOSIS — O2390 Unspecified genitourinary tract infection in pregnancy, unspecified trimester: Secondary | ICD-10-CM | POA: Insufficient documentation

## 2012-01-31 LAB — URINALYSIS, ROUTINE W REFLEX MICROSCOPIC
Bilirubin Urine: NEGATIVE
Glucose, UA: NEGATIVE mg/dL
Hgb urine dipstick: NEGATIVE
Protein, ur: NEGATIVE mg/dL

## 2012-01-31 LAB — URINE MICROSCOPIC-ADD ON

## 2012-01-31 LAB — WET PREP, GENITAL

## 2012-01-31 MED ORDER — FLUCONAZOLE 150 MG PO TABS
150.0000 mg | ORAL_TABLET | ORAL | Status: AC
  Administered 2012-01-31: 150 mg via ORAL
  Filled 2012-01-31: qty 1

## 2012-01-31 MED ORDER — METRONIDAZOLE 500 MG PO TABS
2000.0000 mg | ORAL_TABLET | ORAL | Status: AC
  Administered 2012-01-31: 2000 mg via ORAL
  Filled 2012-01-31: qty 4

## 2012-01-31 NOTE — MAU Provider Note (Addendum)
Chief Complaint:  Pelvic Pain   None     HPI: [name] is a 21 y.o. G2P1001 at [redacted]w[redacted]d who presents to maternity admissions reporting left-sided pelvic bone pain. States this pain has been present for a few months. Pain is not sharp or throbbing. States it feels like pressure on her pelvic bone. She notices it most often during the middle of the night when she wakes up to use the restroom. Pain causes her difficulty walking. Pain is usually gone by 12 noon. When asked to point to the location of pain, pt points to her left inguinal region. Also reports noticing pink/orange discharge that started a few days ago. Has only noticed a few intermittent spots of discharge. Denies vaginal pain or itching. She denies nausea, vomiting, other abdominal pain, headache, fever, chills, chest pain, palpitations, dysuria, urinary frequency or urgency. . Denies contractions, leakage of fluid or vaginal bleeding. Good fetal movement felt within the last hour. Feels her baby moving and poking against her in her left lower abdomen often, in the near vicinity of where her inguinal pain is located. States she has maybe felt a few occasional contractions that have not concerned her. She states they have been very mild, last only a few seconds, and never been more than one in a day.  Pt has not received any regular prenatal care. Was seen here in June, some prenatal labs were performed at that time. Pt was in the process of getting her Medicaid. By the time she had her Medicaid, the clinic she was planning to go to for prenatal care refused to see her because of her advanced gestational age. Pt states she was told that the ultrasound performed in June revealed a possible fetal cardiac abnormality. Pt was told her baby may have Down syndrome. A quad screen was ordered. Pt states she was planning to have the results of the quad screen sent to the clinic once she began prenatal care, but was never seen in a clinic. Looking  through her chart, it appears that the quad screen was either canceled or the results never reported. Pt states her daughter had some type of "hole in her heart" when she was born and is healthy.   Past Medical History: Past Medical History  Diagnosis Date  . Anemia   . Chlamydia 2012    Past obstetric history:     OB History    Grav Para Term Preterm Abortions TAB SAB Ect Mult Living   2 1 1       1      # Outc Date GA Lbr Len/2nd Wgt Sex Del Anes PTL Lv   1 TRM            2 CUR               Past Surgical History: Past Surgical History  Procedure Date  . Cesarean section   . Tooth extraction 2010    Family History: Family History  Problem Relation Age of Onset  . Cancer Maternal Aunt   . Anesthesia problems Neg Hx   . Hypotension Neg Hx   . Malignant hyperthermia Neg Hx   . Pseudochol deficiency Neg Hx   . Other Neg Hx     Social History: History  Substance Use Topics  . Smoking status: Former Smoker -- 1.0 packs/day    Types: Cigarettes    Quit date: 07/12/2011  . Smokeless tobacco: Never Used  . Alcohol Use: No    Allergies:  No Known Allergies  Meds:  Prescriptions prior to admission  Medication Sig Dispense Refill  . Prenatal Vit-Fe Fumarate-FA (PRENATAL MULTIVITAMIN) TABS Take 1 tablet by mouth at bedtime.         ROS: Pertinent findings in history of present illness.  Physical Exam  Blood pressure 107/60, pulse 84, temperature 98 F (36.7 C), temperature source Oral, resp. rate 18, height 5\' 6"  (1.676 m), weight 62.313 kg (137 lb 6 oz), last menstrual period 05/30/2011. GENERAL: Well-developed, well-nourished female in no acute distress.  HEENT: normocephalic, atraumatic HEART: regular rate and rhythm; S1 and S2 distinct without murmur RESP: normal effort, clear to auscultation bilaterally; no wheezes, rales, or rhonchi ABDOMEN: Soft, non-tender, gravid appropriate for gestational age EXTREMITIES: Nontender, no edema NEURO: alert and  oriented SPECULUM EXAM: NEFG, moderate amount of frothy yellow/white discharge present on cervix and in vaginal vault; no blood  Dilation: 1 Effacement (%): Thick Cervical Position: Posterior Exam by:: Sharen Counter CNM    FHT:  Baseline 120 , moderate variability, accelerations present, no decelerations Contractions: frequent UI   Labs: Results for orders placed during the hospital encounter of 01/31/12 (from the past 24 hour(s))  URINALYSIS, ROUTINE W REFLEX MICROSCOPIC     Status: Abnormal   Collection Time   01/31/12  3:25 PM      Component Value Range   Color, Urine YELLOW  YELLOW   APPearance CLEAR  CLEAR   Specific Gravity, Urine 1.020  1.005 - 1.030   pH 7.5  5.0 - 8.0   Glucose, UA NEGATIVE  NEGATIVE mg/dL   Hgb urine dipstick NEGATIVE  NEGATIVE   Bilirubin Urine NEGATIVE  NEGATIVE   Ketones, ur NEGATIVE  NEGATIVE mg/dL   Protein, ur NEGATIVE  NEGATIVE mg/dL   Urobilinogen, UA 0.2  0.0 - 1.0 mg/dL   Nitrite NEGATIVE  NEGATIVE   Leukocytes, UA TRACE (*) NEGATIVE  URINE MICROSCOPIC-ADD ON     Status: Normal   Collection Time   01/31/12  3:25 PM      Component Value Range   Squamous Epithelial / LPF RARE  RARE   WBC, UA 3-6  <3 WBC/hpf   Urine-Other TRICHOMONAS PRESENT    WET PREP, GENITAL     Status: Abnormal   Collection Time   01/31/12  4:15 PM      Component Value Range   Yeast Wet Prep HPF POC FEW (*) NONE SEEN   Trich, Wet Prep MODERATE (*) NONE SEEN   Clue Cells Wet Prep HPF POC NONE SEEN  NONE SEEN   WBC, Wet Prep HPF POC MODERATE (*) NONE SEEN     MAU Course:  Pt interviewed by provider at 4:00pm. Labs ordered. Pelvic examination, specimens collected, and cervical check performed at 4:20pm. Results discussed with patient at 4:45pm.   Assessment: 1. Vaginal trichomonas & candidiasis 2. Viable pregnancy at [redacted]w[redacted]d   Plan:  Message sent to [facility] to schedule patient for prenatal care follow-up.  1. Metronidazole 2 g PO and Diflucan 150 mg PO  given in MAU - Discussed treatment and abstinence for at least 10 days. Pt states she is no longer sexually active and that her previous partner and father of baby is currently incarcerated and she will not be having any contact with him, so expedited partner treatment not possible. Will call her if results of GC/Chl are positive and treat. Discussed test of cure. Pt will follow-up at [facility]. Discharge home. Discussed round ligament pain. Labor precautions and fetal kick  counts. Urged to return if symptoms worsen, if regular or strong contractions lasting for longer than 1 hour, significant abdominal pain, leakage of fluid, vaginal bleeding, lack of fetal movements, or any other concerning symptoms. Pt will follow-up at [facility].        Medication List     As of 01/31/2012  5:01 PM    ASK your doctor about these medications         prenatal multivitamin Tabs   Take 1 tablet by mouth at bedtime.         Harriet Masson, PA-S2 01/31/2012 5:01 PM  I have seen this patient and agree with the above PA student's note. Message sent to clinic to set up appointment for pt as soon as possible.   Sharen Counter, CNM

## 2012-01-31 NOTE — MAU Provider Note (Signed)
Medical Screening exam and patient care preformed by advanced practice provider.  Agree with the above management.  

## 2012-01-31 NOTE — MAU Note (Signed)
Pt states having pelvic bone pain, and has noted pinkish orange discharge a few days ago. Has difficulty walking to restroom. Has been hurting for months. Pain is not sharp, feels pressure on her pelvic bone.

## 2012-02-01 LAB — GC/CHLAMYDIA PROBE AMP, GENITAL: GC Probe Amp, Genital: NEGATIVE

## 2012-02-03 NOTE — MAU Provider Note (Signed)
Medical Screening exam and patient care preformed by advanced practice provider.  Agree with the above management.  

## 2012-02-19 ENCOUNTER — Encounter: Payer: Self-pay | Admitting: Advanced Practice Midwife

## 2012-02-19 ENCOUNTER — Ambulatory Visit (INDEPENDENT_AMBULATORY_CARE_PROVIDER_SITE_OTHER): Payer: Medicaid Other | Admitting: Advanced Practice Midwife

## 2012-02-19 VITALS — BP 114/77 | Temp 98.2°F | Wt 140.3 lb

## 2012-02-19 DIAGNOSIS — O34219 Maternal care for unspecified type scar from previous cesarean delivery: Secondary | ICD-10-CM

## 2012-02-19 DIAGNOSIS — O358XX0 Maternal care for other (suspected) fetal abnormality and damage, not applicable or unspecified: Secondary | ICD-10-CM

## 2012-02-19 DIAGNOSIS — O35BXX0 Maternal care for other (suspected) fetal abnormality and damage, fetal cardiac anomalies, not applicable or unspecified: Secondary | ICD-10-CM

## 2012-02-19 DIAGNOSIS — Z23 Encounter for immunization: Secondary | ICD-10-CM

## 2012-02-19 DIAGNOSIS — O0930 Supervision of pregnancy with insufficient antenatal care, unspecified trimester: Secondary | ICD-10-CM

## 2012-02-19 LAB — POCT URINALYSIS DIP (DEVICE)
Bilirubin Urine: NEGATIVE
Ketones, ur: NEGATIVE mg/dL
Nitrite: NEGATIVE
pH: 7 (ref 5.0–8.0)

## 2012-02-19 LAB — HIV ANTIBODY (ROUTINE TESTING W REFLEX): HIV: NONREACTIVE

## 2012-02-19 MED ORDER — INFLUENZA VIRUS VACC SPLIT PF IM SUSP
0.5000 mL | Freq: Once | INTRAMUSCULAR | Status: AC
  Administered 2012-02-19: 0.5 mL via INTRAMUSCULAR

## 2012-02-19 MED ORDER — TETANUS-DIPHTH-ACELL PERTUSSIS 5-2.5-18.5 LF-MCG/0.5 IM SUSP
0.5000 mL | Freq: Once | INTRAMUSCULAR | Status: AC
  Administered 2012-02-19: 0.5 mL via INTRAMUSCULAR

## 2012-02-19 NOTE — Progress Notes (Signed)
Seen and examined by me also. Agree with note. 

## 2012-02-19 NOTE — Patient Instructions (Signed)
Pregnancy - Third Trimester  The third trimester of pregnancy (the last 3 months) is a period of the most rapid growth for you and your baby. The baby approaches a length of 20 inches and a weight of 6 to 10 pounds. The baby is adding on fat and getting ready for life outside your body. While inside, babies have periods of sleeping and waking, suck their thumbs, and hiccups. You can often feel small contractions of the uterus. This is false labor. It is also called Braxton-Hicks contractions. This is like a practice for labor. The usual problems in this stage of pregnancy include more difficulty breathing, swelling of the hands and feet from water retention, and having to urinate more often because of the uterus and baby pressing on your bladder.   PRENATAL EXAMS  · Blood work may continue to be done during prenatal exams. These tests are done to check on your health and the probable health of your baby. Blood work is used to follow your blood levels (hemoglobin). Anemia (low hemoglobin) is common during pregnancy. Iron and vitamins are given to help prevent this. You may also continue to be checked for diabetes. Some of the past blood tests may be done again.  · The size of the uterus is measured during each visit. This makes sure your baby is growing properly according to your pregnancy dates.  · Your blood pressure is checked every prenatal visit. This is to make sure you are not getting toxemia.  · Your urine is checked every prenatal visit for infection, diabetes and protein.  · Your weight is checked at each visit. This is done to make sure gains are happening at the suggested rate and that you and your baby are growing normally.  · Sometimes, an ultrasound is performed to confirm the position and the proper growth and development of the baby. This is a test done that bounces harmless sound waves off the baby so your caregiver can more accurately determine due dates.  · Discuss the type of pain medication and  anesthesia you will have during your labor and delivery.  · Discuss the possibility and anesthesia if a Cesarean Section might be necessary.  · Inform your caregiver if there is any mental or physical violence at home.  Sometimes, a specialized non-stress test, contraction stress test and biophysical profile are done to make sure the baby is not having a problem. Checking the amniotic fluid surrounding the baby is called an amniocentesis. The amniotic fluid is removed by sticking a needle into the belly (abdomen). This is sometimes done near the end of pregnancy if an early delivery is required. In this case, it is done to help make sure the baby's lungs are mature enough for the baby to live outside of the womb. If the lungs are not mature and it is unsafe to deliver the baby, an injection of cortisone medication is given to the mother 1 to 2 days before the delivery. This helps the baby's lungs mature and makes it safer to deliver the baby.  CHANGES OCCURING IN THE THIRD TRIMESTER OF PREGNANCY  Your body goes through many changes during pregnancy. They vary from person to person. Talk to your caregiver about changes you notice and are concerned about.  · During the last trimester, you have probably had an increase in your appetite. It is normal to have cravings for certain foods. This varies from person to person and pregnancy to pregnancy.  · You may begin to   get stretch marks on your hips, abdomen, and breasts. These are normal changes in the body during pregnancy. There are no exercises or medications to take which prevent this change.  · Constipation may be treated with a stool softener or adding bulk to your diet. Drinking lots of fluids, fiber in vegetables, fruits, and whole grains are helpful.  · Exercising is also helpful. If you have been very active up until your pregnancy, most of these activities can be continued during your pregnancy. If you have been less active, it is helpful to start an exercise  program such as walking. Consult your caregiver before starting exercise programs.  · Avoid all smoking, alcohol, un-prescribed drugs, herbs and "street drugs" during your pregnancy. These chemicals affect the formation and growth of the baby. Avoid chemicals throughout the pregnancy to ensure the delivery of a healthy infant.  · Backache, varicose veins and hemorrhoids may develop or get worse.  · You will tire more easily in the third trimester, which is normal.  · The baby's movements may be stronger and more often.  · You may become short of breath easily.  · Your belly button may stick out.  · A yellow discharge may leak from your breasts called colostrum.  · You may have a bloody mucus discharge. This usually occurs a few days to a week before labor begins.  HOME CARE INSTRUCTIONS   · Keep your caregiver's appointments. Follow your caregiver's instructions regarding medication use, exercise, and diet.  · During pregnancy, you are providing food for you and your baby. Continue to eat regular, well-balanced meals. Choose foods such as meat, fish, milk and other low fat dairy products, vegetables, fruits, and whole-grain breads and cereals. Your caregiver will tell you of the ideal weight gain.  · A physical sexual relationship may be continued throughout pregnancy if there are no other problems such as early (premature) leaking of amniotic fluid from the membranes, vaginal bleeding, or belly (abdominal) pain.  · Exercise regularly if there are no restrictions. Check with your caregiver if you are unsure of the safety of your exercises. Greater weight gain will occur in the last 2 trimesters of pregnancy. Exercising helps:  · Control your weight.  · Get you in shape for labor and delivery.  · You lose weight after you deliver.  · Rest a lot with legs elevated, or as needed for leg cramps or low back pain.  · Wear a good support or jogging bra for breast tenderness during pregnancy. This may help if worn during  sleep. Pads or tissues may be used in the bra if you are leaking colostrum.  · Do not use hot tubs, steam rooms, or saunas.  · Wear your seat belt when driving. This protects you and your baby if you are in an accident.  · Avoid raw meat, cat litter boxes and soil used by cats. These carry germs that can cause birth defects in the baby.  · It is easier to loose urine during pregnancy. Tightening up and strengthening the pelvic muscles will help with this problem. You can practice stopping your urination while you are going to the bathroom. These are the same muscles you need to strengthen. It is also the muscles you would use if you were trying to stop from passing gas. You can practice tightening these muscles up 10 times a set and repeating this about 3 times per day. Once you know what muscles to tighten up, do not perform these   exercises during urination. It is more likely to cause an infection by backing up the urine.  · Ask for help if you have financial, counseling or nutritional needs during pregnancy. Your caregiver will be able to offer counseling for these needs as well as refer you for other special needs.  · Make a list of emergency phone numbers and have them available.  · Plan on getting help from family or friends when you go home from the hospital.  · Make a trial run to the hospital.  · Take prenatal classes with the father to understand, practice and ask questions about the labor and delivery.  · Prepare the baby's room/nursery.  · Do not travel out of the city unless it is absolutely necessary and with the advice of your caregiver.  · Wear only low or no heal shoes to have better balance and prevent falling.  MEDICATIONS AND DRUG USE IN PREGNANCY  · Take prenatal vitamins as directed. The vitamin should contain 1 milligram of folic acid. Keep all vitamins out of reach of children. Only a couple vitamins or tablets containing iron may be fatal to a baby or young child when ingested.  · Avoid use  of all medications, including herbs, over-the-counter medications, not prescribed or suggested by your caregiver. Only take over-the-counter or prescription medicines for pain, discomfort, or fever as directed by your caregiver. Do not use aspirin, ibuprofen (Motrin®, Advil®, Nuprin®) or naproxen (Aleve®) unless OK'd by your caregiver.  · Let your caregiver also know about herbs you may be using.  · Alcohol is related to a number of birth defects. This includes fetal alcohol syndrome. All alcohol, in any form, should be avoided completely. Smoking will cause low birth rate and premature babies.  · Street/illegal drugs are very harmful to the baby. They are absolutely forbidden. A baby born to an addicted mother will be addicted at birth. The baby will go through the same withdrawal an adult does.  SEEK MEDICAL CARE IF:  You have any concerns or worries during your pregnancy. It is better to call with your questions if you feel they cannot wait, rather than worry about them.  DECISIONS ABOUT CIRCUMCISION  You may or may not know the sex of your baby. If you know your baby is a boy, it may be time to think about circumcision. Circumcision is the removal of the foreskin of the penis. This is the skin that covers the sensitive end of the penis. There is no proven medical need for this. Often this decision is made on what is popular at the time or based upon religious beliefs and social issues. You can discuss these issues with your caregiver or pediatrician.  SEEK IMMEDIATE MEDICAL CARE IF:   · An unexplained oral temperature above 102° F (38.9° C) develops, or as your caregiver suggests.  · You have leaking of fluid from the vagina (birth canal). If leaking membranes are suspected, take your temperature and tell your caregiver of this when you call.  · There is vaginal spotting, bleeding or passing clots. Tell your caregiver of the amount and how many pads are used.  · You develop a bad smelling vaginal discharge with  a change in the color from clear to white.  · You develop vomiting that lasts more than 24 hours.  · You develop chills or fever.  · You develop shortness of breath.  · You develop burning on urination.  · You loose more than 2 pounds of weight   or gain more than 2 pounds of weight or as suggested by your caregiver.  · You notice sudden swelling of your face, hands, and feet or legs.  · You develop belly (abdominal) pain. Round ligament discomfort is a common non-cancerous (benign) cause of abdominal pain in pregnancy. Your caregiver still must evaluate you.  · You develop a severe headache that does not go away.  · You develop visual problems, blurred or double vision.  · If you have not felt your baby move for more than 1 hour. If you think the baby is not moving as much as usual, eat something with sugar in it and lie down on your left side for an hour. The baby should move at least 4 to 5 times per hour. Call right away if your baby moves less than that.  · You fall, are in a car accident or any kind of trauma.  · There is mental or physical violence at home.  Document Released: 03/12/2001 Document Revised: 06/10/2011 Document Reviewed: 09/14/2008  Exit[employer]® Patient Information ©2013 [employer].

## 2012-02-19 NOTE — Progress Notes (Signed)
   Subjective:    [name] is a G2P1001 [redacted]w[redacted]d being seen today for her first obstetrical visit.  Her obstetrical history is significant for "emergency c-section for a large baby", hyperemesis gravidarum and no prenatal care with this pregnancy.  Unsure if patient intends to breast feed. Pregnancy history fully reviewed.  Patient reports no complaints.  Filed Vitals:   02/19/12 0837  BP: 114/77  Temp: 98.2 F (36.8 C)  Weight: 140 lb 4.8 oz (63.64 kg)    HISTORY: OB History    Grav Para Term Preterm Abortions TAB SAB Ect Mult Living   2 1 1       1      # Outc Date GA Lbr Len/2nd Wgt Sex Del Anes PTL Lv   1 TRM 10/09 [redacted]w[redacted]d  8lb1oz(3.657kg) F LTCS Spinal  Yes   2 CUR              Past Medical History  Diagnosis Date  . Anemia   . Chlamydia 2012   Past Surgical History  Procedure Date  . Cesarean section   . Tooth extraction 2010   Family History  Problem Relation Age of Onset  . Cancer Maternal Aunt   . Anesthesia problems Neg Hx   . Hypotension Neg Hx   . Malignant hyperthermia Neg Hx   . Pseudochol deficiency Neg Hx   . Other Neg Hx      Exam    Uterus:  Fundal Height: 37 cm  Pelvic Exam:    Perineum: Normal Perineum   Vulva: normal, Bartholin's, Urethra, Skene's normal   Vagina:  GBS done, no SSE   Cervix: 3/70%/-3, posterior   Adnexa: normal adnexa   Bony Pelvis: gynecoid  System: Breast:  not assessed   Skin: normal coloration and turgor, no rashes    Neurologic: oriented, normal, normal mood, gait normal; reflexes normal and symmetric   Extremities: normal strength, tone, and muscle mass, ROM of all joints is normal   HEENT extra ocular movement intact   Mouth/Teeth mucous membranes moist, pharynx normal without lesions and dental hygiene good   Neck supple and no masses   Cardiovascular: regular rate and rhythm, no murmurs or gallops   Respiratory:  appears well, vitals normal, no respiratory distress, acyanotic, normal RR, ear and throat  exam is normal, no sinus tenderness noted, neck free of mass or lymphadenopathy, chest clear, no wheezing, crepitations, rhonchi, normal symmetric air entry   Abdomen: soft, non-tender; bowel sounds normal; no masses,  no organomegaly   Urinary: urethral meatus normal      Assessment:    Pregnancy: G2P1001 Patient Active Problem List  Diagnosis  . Hyperemesis gravidarum  . Trichomonas vaginalis (TV) infection  . Candida vaginitis  . Round ligament pain  . Hx of cesarean section complicating pregnancy  . Echogenic focus of heart of fetus affecting antepartum care of mother  . Supervision of high-risk pregnancy with insufficient prenatal care        Plan:     Initial labs drawn. GBS done today. Flu vaccine and Tdap today. Prenatal vitamins. Problem list reviewed and updated. Ultrasound discussed; fetal survey: results reviewed. Follow up in 1 weeks. 50% of 30 min visit spent on counseling and coordination of care.   Raelyn Mora, SNM 02/19/2012 Supervised by: Wynelle Bourgeois, CNM

## 2012-02-19 NOTE — Progress Notes (Signed)
Pulse 86 1 hr gtt due at 0940 Would like flu vaccine and tdap

## 2012-02-20 ENCOUNTER — Encounter (HOSPITAL_COMMUNITY): Payer: Self-pay | Admitting: *Deleted

## 2012-02-20 ENCOUNTER — Inpatient Hospital Stay (HOSPITAL_COMMUNITY)
Admission: AD | Admit: 2012-02-20 | Discharge: 2012-02-22 | DRG: 775 | Disposition: A | Discharge status: Home / Self Care | Payer: Medicaid Other | Source: Ambulatory Visit | Attending: Obstetrics & Gynecology | Admitting: Obstetrics & Gynecology

## 2012-02-20 DIAGNOSIS — O358XX0 Maternal care for other (suspected) fetal abnormality and damage, not applicable or unspecified: Secondary | ICD-10-CM

## 2012-02-20 DIAGNOSIS — O35BXX0 Maternal care for other (suspected) fetal abnormality and damage, fetal cardiac anomalies, not applicable or unspecified: Secondary | ICD-10-CM

## 2012-02-20 DIAGNOSIS — O34219 Maternal care for unspecified type scar from previous cesarean delivery: Secondary | ICD-10-CM

## 2012-02-20 DIAGNOSIS — O0930 Supervision of pregnancy with insufficient antenatal care, unspecified trimester: Secondary | ICD-10-CM

## 2012-02-20 LAB — TYPE AND SCREEN: ABO/RH(D): O NEG

## 2012-02-20 LAB — OBSTETRIC PANEL
Antibody Screen: NEGATIVE
Basophils Relative: 0 % (ref 0–1)
Eosinophils Absolute: 0 10*3/uL (ref 0.0–0.7)
Eosinophils Relative: 1 % (ref 0–5)
HCT: 33.1 % — ABNORMAL LOW (ref 36.0–46.0)
Hemoglobin: 10.9 g/dL — ABNORMAL LOW (ref 12.0–15.0)
MCH: 27.8 pg (ref 26.0–34.0)
MCHC: 32.9 g/dL (ref 30.0–36.0)
Monocytes Absolute: 0.3 10*3/uL (ref 0.1–1.0)
Monocytes Relative: 7 % (ref 3–12)
Rh Type: POSITIVE

## 2012-02-20 LAB — CBC
MCHC: 32.6 g/dL (ref 30.0–36.0)
MCV: 84.4 fL (ref 78.0–100.0)
Platelets: 136 10*3/uL — ABNORMAL LOW (ref 150–400)
RDW: 13.4 % (ref 11.5–15.5)
WBC: 8.5 10*3/uL (ref 4.0–10.5)

## 2012-02-20 MED ORDER — SENNOSIDES-DOCUSATE SODIUM 8.6-50 MG PO TABS
2.0000 | ORAL_TABLET | Freq: Every day | ORAL | Status: DC
  Administered 2012-02-20: 2 via ORAL

## 2012-02-20 MED ORDER — LACTATED RINGERS IV SOLN: INTRAVENOUS | Status: DC

## 2012-02-20 MED ORDER — CITRIC ACID-SODIUM CITRATE 334-500 MG/5ML PO SOLN: 30.0000 mL | ORAL | Status: DC | PRN

## 2012-02-20 MED ORDER — IBUPROFEN 600 MG PO TABS
600.0000 mg | ORAL_TABLET | Freq: Four times a day (QID) | ORAL | Status: DC | PRN
  Administered 2012-02-20 (×3): 600 mg via ORAL
  Filled 2012-02-20 (×7): qty 1

## 2012-02-20 MED ORDER — ONDANSETRON HCL 4 MG PO TABS: 4.0000 mg | ORAL_TABLET | ORAL | Status: DC | PRN

## 2012-02-20 MED ORDER — LIDOCAINE HCL (PF) 1 % IJ SOLN
30.0000 mL | INTRAMUSCULAR | Status: AC | PRN
  Administered 2012-02-20: 30 mL via SUBCUTANEOUS
  Filled 2012-02-20: qty 30

## 2012-02-20 MED ORDER — SIMETHICONE 80 MG PO CHEW: 80.0000 mg | CHEWABLE_TABLET | ORAL | Status: DC | PRN

## 2012-02-20 MED ORDER — PRENATAL MULTIVITAMIN CH
1.0000 | ORAL_TABLET | Freq: Every day | ORAL | Status: DC
  Administered 2012-02-20: 1 via ORAL

## 2012-02-20 MED ORDER — PRENATAL MULTIVITAMIN CH
1.0000 | ORAL_TABLET | Freq: Every day | ORAL | Status: DC
  Administered 2012-02-21 – 2012-02-22 (×2): 1 via ORAL
  Filled 2012-02-20 (×3): qty 1

## 2012-02-20 MED ORDER — DIPHENHYDRAMINE HCL 25 MG PO CAPS: 25.0000 mg | ORAL_CAPSULE | Freq: Four times a day (QID) | ORAL | Status: DC | PRN

## 2012-02-20 MED ORDER — ZOLPIDEM TARTRATE 5 MG PO TABS: 5.0000 mg | ORAL_TABLET | Freq: Every evening | ORAL | Status: DC | PRN

## 2012-02-20 MED ORDER — FENTANYL CITRATE 0.05 MG/ML IJ SOLN
100.0000 ug | INTRAMUSCULAR | Status: DC | PRN
  Filled 2012-02-20: qty 2

## 2012-02-20 MED ORDER — ONDANSETRON HCL 4 MG/2ML IJ SOLN: 4.0000 mg | INTRAMUSCULAR | Status: DC | PRN

## 2012-02-20 MED ORDER — BENZOCAINE-MENTHOL 20-0.5 % EX AERO: 1.0000 "application " | INHALATION_SPRAY | CUTANEOUS | Status: DC | PRN

## 2012-02-20 MED ORDER — DIBUCAINE 1 % RE OINT: 1.0000 "application " | TOPICAL_OINTMENT | RECTAL | Status: DC | PRN

## 2012-02-20 MED ORDER — OXYTOCIN BOLUS FROM INFUSION: 500.0000 mL | INTRAVENOUS | Status: DC

## 2012-02-20 MED ORDER — WITCH HAZEL-GLYCERIN EX PADS: 1.0000 "application " | MEDICATED_PAD | CUTANEOUS | Status: DC | PRN

## 2012-02-20 MED ORDER — LANOLIN HYDROUS EX OINT: TOPICAL_OINTMENT | CUTANEOUS | Status: DC | PRN

## 2012-02-20 MED ORDER — OXYTOCIN 40 UNITS IN LACTATED RINGERS INFUSION - SIMPLE MED
62.5000 mL/h | INTRAVENOUS | Status: DC
  Administered 2012-02-20: 62.5 mL/h via INTRAVENOUS
  Filled 2012-02-20: qty 1000

## 2012-02-20 MED ORDER — ONDANSETRON HCL 4 MG/2ML IJ SOLN: 4.0000 mg | Freq: Four times a day (QID) | INTRAMUSCULAR | Status: DC | PRN

## 2012-02-20 MED ORDER — IBUPROFEN 600 MG PO TABS
600.0000 mg | ORAL_TABLET | Freq: Four times a day (QID) | ORAL | Status: DC
  Administered 2012-02-20 – 2012-02-22 (×7): 600 mg via ORAL
  Filled 2012-02-20 (×4): qty 1

## 2012-02-20 MED ORDER — FLEET ENEMA 7-19 GM/118ML RE ENEM: 1.0000 | ENEMA | RECTAL | Status: DC | PRN

## 2012-02-20 MED ORDER — OXYCODONE-ACETAMINOPHEN 5-325 MG PO TABS: 1.0000 | ORAL_TABLET | ORAL | Status: DC | PRN

## 2012-02-20 MED ORDER — TETANUS-DIPHTH-ACELL PERTUSSIS 5-2.5-18.5 LF-MCG/0.5 IM SUSP: 0.5000 mL | Freq: Once | INTRAMUSCULAR | Status: DC

## 2012-02-20 MED ORDER — LACTATED RINGERS IV SOLN: 500.0000 mL | INTRAVENOUS | Status: DC | PRN

## 2012-02-20 MED ORDER — OXYCODONE-ACETAMINOPHEN 5-325 MG PO TABS
1.0000 | ORAL_TABLET | ORAL | Status: DC | PRN
  Administered 2012-02-20: 1 via ORAL
  Filled 2012-02-20: qty 1

## 2012-02-20 MED ORDER — ACETAMINOPHEN 325 MG PO TABS: 650.0000 mg | ORAL_TABLET | ORAL | Status: DC | PRN

## 2012-02-20 NOTE — Progress Notes (Signed)
Pt ambulates to br, unable to void.  Gown changed, pericare done.  Transferred to room 315

## 2012-02-20 NOTE — H&P (Signed)
[name] is a 20 y.o. female with hx of late and limited prenatal care presenting for labor. Contractions started yesterday around 10:00PM. She denies leakage of fluid or vaginal bleeding. Afebrile.   History OB History    Grav Para Term Preterm Abortions TAB SAB Ect Mult Living   2 1 1       1     Emergent Prior Csection due to macrosomia: low transverse C section.  Past Medical History  Diagnosis Date  . Anemia   . Chlamydia 2012   Past Surgical History  Procedure Date  . Cesarean section   . Tooth extraction 2010   Family History: family history includes Cancer in her maternal aunt.  There is no history of Anesthesia problems, and Hypotension, and Malignant hyperthermia, and Pseudochol deficiency, and Other, . Social History:  reports that she quit smoking about 7 months ago. Her smoking use included Cigarettes. She smoked 1 pack per day. She has never used smokeless tobacco. She reports that she does not drink alcohol or use illicit drugs.   Prenatal Transfer Tool  Maternal Diabetes: not known. Late care. Genetic Screening: not done Maternal Ultrasounds/Referrals: Abnormal:  Findings:   Other: Fetal Ultrasounds or other Referrals:  none Maternal Substance Abuse:  Pt denies use Significant Maternal Medications:  None Significant Maternal Lab Results:  None Other Comments:  late and limnited prenatal care.  ROS Per HPI   Blood pressure 138/87, pulse 106, temperature 97.8 F (36.6 C), resp. rate 18, last menstrual period 05/30/2011. Exam Dilatation: 8 cm  Effacement: complete. Station: -1 Intact membranes. Presentation: vertex   Physical Exam  Gen:  NAD HEENT: Moist mucous membranes  CV: Regular rate and rhythm, no murmurs rubs or gallops PULM: Clear to auscultation bilaterally. No wheezes/rales/rhonchi ABD: Soft, gravida, normal bowel sounds EXT: No edema Neuro: Alert and oriented x3. No focalization  Prenatal labs: ABO, Rh: --/--/O NEG (06/27  1540) Antibody:   Rubella: 186.4 (11/20 0954) RPR: NON REAC (11/20 0954)  HBsAg: NEGATIVE (11/20 0954)  HIV: NON REACTIVE (11/20 0954)  GBS:   Unknown Ultrasound with echogenic focus on LV (soft marker for Down Syndrome. Quad screen ordered and cancelled. No results found  Assessment/Plan: 1. Labor: active phase on a pt with limited and late prenatal care and hx of prior C section for macrosomia.  2. Fetal Wellbeing: Category  3. Pain Control: IV medication 4. AVW:[id number] 5. GTT: unknown 6. 38.0 week IUP  Plan:  1. Admit to BS 2. Routine L&D orders.  3. Analgesia/anesthesia PRN    PILOTO, DAYARMYS 02/20/2012, 3:43 AM

## 2012-02-20 NOTE — Progress Notes (Signed)
UR done. 

## 2012-02-20 NOTE — H&P (Signed)
I have seen and examined patient. History of primary low transverse c-section in 2009 for arrest of descent. Pt states she pushed for an hour. Per op note, uterine incision was low transverse. Patient desires TOLAC. Risks and benefits discussed, consent signed. Since she is already dilated to 8 cm, confirmed vertex by exam, we will proceed with attempted vaginal delivery. There are no recent ultrasounds to indicate fetal weight, but by exam baby does not appear to be LGA. No GBS available but baby is term by 18 week sono so no antibiotics.  I have reviewed NST - reactive, Category I, labs, history.  Napoleon Form, MD

## 2012-02-20 NOTE — Clinical Social Work Maternal (Signed)
     Clinical Social Work Department PSYCHOSOCIAL ASSESSMENT - MATERNAL/CHILD 02/20/2012  Patient:  [name], [name]  Account Number:  [id number]  Admit Date:  02/20/2012  [name] Name:   [name]    Clinical Social Worker:  Reece Levy, [place]   Date/Time:  02/20/2012 11:12 AM  Date Referred:  02/20/2012   Referral source  Physician     Referred reason  [facility]   Other referral source:    I:  FAMILY / HOME ENVIRONMENT [name] legal guardian:  PARENT  Guardian - Name Guardian - Age Guardian - Address  [name] [address] [place]   Other household support members/support persons Name Relationship DOB  Maternal GM GRAND MOTHER   [name] SISTER 4yo  MOB's sister AUNT 61   Other support:   extended family and friends    II  PSYCHOSOCIAL DATA Information Source:  Other - See comment  Financial and Community Resources Employment:   MOB- Research scientist (life sciences) resources:  [employer] If Medicaid - County:  [place] Other  [employer]  Chemical engineer / Grade:  12th Government social research officer / Statistician / Early Interventions:   NO PNC  Cultural issues impacting care:   FOB in jail and "not in the picture"    III  STRENGTHS Strengths  Adequate Resources  Home prepared for Child (including basic supplies)  Supportive family/friends   Strength comment:  Patient reports she has good support and is excited about the birth of her son-   IV  RISK FACTORS AND CURRENT PROBLEMS Current Problem:  None   Risk Factor & Current Problem Patient Issue Family Issue Risk Factor / Current Problem Comment  Other - See comment Y Y NO PNC, HX THC USE    V  SOCIAL WORK ASSESSMENT Discussed patient's no PNC- she says she went to get her Medicaid approved at @4months  and it took months for the paperwork to get processed and approved- and at that point she was told it was too late to be seen at clinic and the Health Dept was full. She is pleased  that he is born and healthy-  She also reports hx of THC-but states she hasnt used in over a year- discussed the hospital protocol of checking newborn for [facility] and she is confident that this will be negative and is not concerned. Advised her of protocol to call CPS if the results come back are positive.  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   If child protective services report - county:   If child protective services report - date:   Information/referral to community resources comment:   [facility] info per her request.  DSS   Other social work plan:   CSW to follow up and provide additional support to MOB as well as info related to [employer].

## 2012-02-20 NOTE — MAU Note (Signed)
Pt states contractions started around 2200

## 2012-02-21 DIAGNOSIS — O34219 Maternal care for unspecified type scar from previous cesarean delivery: Secondary | ICD-10-CM

## 2012-02-21 DIAGNOSIS — O0930 Supervision of pregnancy with insufficient antenatal care, unspecified trimester: Secondary | ICD-10-CM

## 2012-02-21 LAB — CBC
MCHC: 32.9 g/dL (ref 30.0–36.0)
Platelets: 141 10*3/uL — ABNORMAL LOW (ref 150–400)
RDW: 13.6 % (ref 11.5–15.5)
WBC: 8.5 10*3/uL (ref 4.0–10.5)

## 2012-02-21 LAB — CULTURE, OB URINE

## 2012-02-21 LAB — KLEIHAUER-BETKE STAIN: Quantitation Fetal Hemoglobin: 0 mL

## 2012-02-21 MED ORDER — RHO D IMMUNE GLOBULIN 1500 UNIT/2ML IJ SOLN
300.0000 ug | Freq: Once | INTRAMUSCULAR | Status: AC
  Administered 2012-02-21: 300 ug via INTRAMUSCULAR
  Filled 2012-02-21: qty 2

## 2012-02-21 NOTE — Progress Notes (Signed)
Provided mom with [employer] information/locations per her request- she was on the phone as I entered the room making f/u appointment for the baby on Monday at [facility] (2:45pm) No further needs identified or voiced by MOB- Reece Levy, MSW, Theresia Majors [phone]

## 2012-02-21 NOTE — Progress Notes (Signed)
Post Partum Day 1 Subjective: no complaints, up ad lib, voiding, tolerating PO and + flatus  Objective: Blood pressure 117/69, pulse 75, temperature 98 F (36.7 C), temperature source Oral, resp. rate 18, height 5\' 6"  (1.676 m), weight 63.504 kg (140 lb), last menstrual period 05/30/2011, SpO2 99.00%.  Physical Exam:  General: alert, cooperative and no distress Lochia: appropriate Uterine Fundus: firm DVT Evaluation: No evidence of DVT seen on physical exam.   Basename 02/21/12 0535 02/20/12 0330  HGB 9.4* 10.8*  HCT 28.6* 33.1*   Assessment/Plan: Plan for discharge tomorrow and Contraception nexplanon Bottle feeding.   LOS: 1 day   PILOTO, Sharea Guinther 02/21/2012, 9:25 AM

## 2012-02-22 LAB — RH IG WORKUP (INCLUDES ABO/RH): ABO/RH(D): O NEG

## 2012-02-22 LAB — CULTURE, BETA STREP (GROUP B ONLY)

## 2012-02-22 MED ORDER — IBUPROFEN 800 MG PO TABS: 800.0000 mg | ORAL_TABLET | Freq: Three times a day (TID) | ORAL | Status: DC | PRN

## 2012-02-22 NOTE — Progress Notes (Signed)
I saw and examined patient and agree with above. Finleigh Cheong, MD 

## 2012-02-22 NOTE — Discharge Summary (Signed)
Obstetric Discharge Summary Reason for Admission: onset of labor Prenatal Procedures: none Intrapartum Procedures: spontaneous vaginal delivery Postpartum Procedures: none Complications-Operative and Postpartum: none Hemoglobin  Date Value Range Status  02/21/2012 9.4* 12.0 - 15.0 g/dL Final     HCT  Date Value Range Status  02/21/2012 28.6* 36.0 - 46.0 % Final    Physical Exam:  General: alert, cooperative and no distress Lochia: appropriate Uterine Fundus: firm   Discharge Diagnoses: Term Pregnancy-delivered  Discharge Information: Date: 02/22/2012 Activity: unrestricted Diet: routine Medications: Ibuprofen Condition: stable and improved Instructions: refer to practice specific booklet Discharge to: home Follow-up Information    Follow up with [facility]. In 6 weeks.   Contact information:   [address] [place] [address] [phone]         Newborn Data: Live born female  Birth Weight: 7 lb 1.2 oz (3210 g) APGAR: 9, 10  Home with mother.  Tawnya Crook 02/22/2012, 7:22 AM

## 2012-02-24 NOTE — Discharge Summary (Signed)
Attestation of Attending Supervision of Advanced Practitioner (CNM/NP): Evaluation and management procedures were performed by the Advanced Practitioner under my supervision and collaboration.  I have reviewed the Advanced Practitioner's note and chart, and I agree with the management and plan.  Drayce Tawil, MD, FACOG Attending Obstetrician & Gynecologist Faculty Practice, [facility]  

## 2012-03-19 ENCOUNTER — Ambulatory Visit (INDEPENDENT_AMBULATORY_CARE_PROVIDER_SITE_OTHER): Payer: Medicaid Other | Admitting: Obstetrics & Gynecology

## 2012-03-19 ENCOUNTER — Encounter: Payer: Self-pay | Admitting: *Deleted

## 2012-03-19 ENCOUNTER — Encounter: Payer: Self-pay | Admitting: Obstetrics & Gynecology

## 2012-03-19 NOTE — Patient Instructions (Signed)
Etonogestrel implant What is this medicine? ETONOGESTREL is a contraceptive (birth control) device. It is used to prevent pregnancy. It can be used for up to 3 years. This medicine may be used for other purposes; ask your health care provider or pharmacist if you have questions. What should I tell my health care provider before I take this medicine? They need to know if you have any of these conditions: -abnormal vaginal bleeding -blood vessel disease or blood clots -cancer of the breast, cervix, or liver -depression -diabetes -gallbladder disease -headaches -heart disease or recent heart attack -high blood pressure -high cholesterol -kidney disease -liver disease -renal disease -seizures -tobacco smoker -an unusual or allergic reaction to etonogestrel, other hormones, anesthetics or antiseptics, medicines, foods, dyes, or preservatives -pregnant or trying to get pregnant -breast-feeding How should I use this medicine? This device is inserted just under the skin on the inner side of your upper arm by a health care professional. Talk to your pediatrician regarding the use of this medicine in children. Special care may be needed. Overdosage: If you think you've taken too much of this medicine contact a poison control center or emergency room at once. Overdosage: If you think you have taken too much of this medicine contact a poison control center or emergency room at once. NOTE: This medicine is only for you. Do not share this medicine with others. What if I miss a dose? This does not apply. What may interact with this medicine? Do not take this medicine with any of the following medications: -amprenavir -bosentan -fosamprenavir This medicine may also interact with the following medications: -barbiturate medicines for inducing sleep or treating seizures -certain medicines for fungal infections like ketoconazole and itraconazole -griseofulvin -medicines to treat seizures like  carbamazepine, felbamate, oxcarbazepine, phenytoin, topiramate -modafinil -phenylbutazone -rifampin -some medicines to treat HIV infection like atazanavir, indinavir, lopinavir, nelfinavir, tipranavir, ritonavir -St. John's wort This list may not describe all possible interactions. Give your health care provider a list of all the medicines, herbs, non-prescription drugs, or dietary supplements you use. Also tell them if you smoke, drink alcohol, or use illegal drugs. Some items may interact with your medicine. What should I watch for while using this medicine? This product does not protect you against HIV infection (AIDS) or other sexually transmitted diseases. You should be able to feel the implant by pressing your fingertips over the skin where it was inserted. Tell your doctor if you cannot feel the implant. What side effects may I notice from receiving this medicine? Side effects that you should report to your doctor or health care professional as soon as possible: -allergic reactions like skin rash, itching or hives, swelling of the face, lips, or tongue -breast lumps -changes in vision -confusion, trouble speaking or understanding -dark urine -depressed mood -general ill feeling or flu-like symptoms -light-colored stools -loss of appetite, nausea -right upper belly pain -severe headaches -severe pain, swelling, or tenderness in the abdomen -shortness of breath, chest pain, swelling in a leg -signs of pregnancy -sudden numbness or weakness of the face, arm or leg -trouble walking, dizziness, loss of balance or coordination -unusual vaginal bleeding, discharge -unusually weak or tired -yellowing of the eyes or skin Side effects that usually do not require medical attention (Report these to your doctor or health care professional if they continue or are bothersome.): -acne -breast pain -changes in weight -cough -fever or chills -headache -irregular menstrual  bleeding -itching, burning, and vaginal discharge -pain or difficulty passing urine -sore throat This   list may not describe all possible side effects. Call your doctor for medical advice about side effects. You may report side effects to FDA at 1-800-FDA-1088. Where should I keep my medicine? This drug is given in a hospital or clinic and will not be stored at home. NOTE: This sheet is a summary. It may not cover all possible information. If you have questions about this medicine, talk to your doctor, pharmacist, or health care provider.  2012, Elsevier/Gold Standard. (12/09/2008 3:54:17 PM)Contraception Choices Contraception (birth control) is the use of any methods or devices to prevent pregnancy. Below are some methods to help avoid pregnancy. HORMONAL METHODS   Contraceptive implant. This is a thin, plastic tube containing progesterone hormone. It does not contain estrogen hormone. Your caregiver inserts the tube in the inner part of the upper arm. The tube can remain in place for up to 3 years. After 3 years, the implant must be removed. The implant prevents the ovaries from releasing an egg (ovulation), thickens the cervical mucus which prevents sperm from entering the uterus, and thins the lining of the inside of the uterus.  Progesterone-only injections. These injections are given every 3 months by your caregiver to prevent pregnancy. This synthetic progesterone hormone stops the ovaries from releasing eggs. It also thickens cervical mucus and changes the uterine lining. This makes it harder for sperm to survive in the uterus.  Birth control pills. These pills contain estrogen and progesterone hormone. They work by stopping the egg from forming in the ovary (ovulation). Birth control pills are prescribed by a caregiver.Birth control pills can also be used to treat heavy periods.  Minipill. This type of birth control pill contains only the progesterone hormone. They are taken every day of  each month and must be prescribed by your caregiver.  Birth control patch. The patch contains hormones similar to those in birth control pills. It must be changed once a week and is prescribed by a caregiver.  Vaginal ring. The ring contains hormones similar to those in birth control pills. It is left in the vagina for 3 weeks, removed for 1 week, and then a new one is put back in place. The patient must be comfortable inserting and removing the ring from the vagina.A caregiver's prescription is necessary.  Emergency contraception. Emergency contraceptives prevent pregnancy after unprotected sexual intercourse. This pill can be taken right after sex or up to 5 days after unprotected sex. It is most effective the sooner you take the pills after having sexual intercourse. Emergency contraceptive pills are available without a prescription. Check with your pharmacist. Do not use emergency contraception as your only form of birth control. BARRIER METHODS   Female condom. This is a thin sheath (latex or rubber) that is worn over the penis during sexual intercourse. It can be used with spermicide to increase effectiveness.  Female condom. This is a soft, loose-fitting sheath that is put into the vagina before sexual intercourse.  Diaphragm. This is a soft, latex, dome-shaped barrier that must be fitted by a caregiver. It is inserted into the vagina, along with a spermicidal jelly. It is inserted before intercourse. The diaphragm should be left in the vagina for 6 to 8 hours after intercourse.  Cervical cap. This is a round, soft, latex or plastic cup that fits over the cervix and must be fitted by a caregiver. The cap can be left in place for up to 48 hours after intercourse.  Sponge. This is a soft, circular piece of polyurethane  foam. The sponge has spermicide in it. It is inserted into the vagina after wetting it and before sexual intercourse.  Spermicides. These are chemicals that kill or block sperm  from entering the cervix and uterus. They come in the form of creams, jellies, suppositories, foam, or tablets. They do not require a prescription. They are inserted into the vagina with an applicator before having sexual intercourse. The process must be repeated every time you have sexual intercourse. INTRAUTERINE CONTRACEPTION  Intrauterine device (IUD). This is a T-shaped device that is put in a woman's uterus during a menstrual period to prevent pregnancy. There are 2 types:  Copper IUD. This type of IUD is wrapped in copper wire and is placed inside the uterus. Copper makes the uterus and fallopian tubes produce a fluid that kills sperm. It can stay in place for 10 years.  Hormone IUD. This type of IUD contains the hormone progestin (synthetic progesterone). The hormone thickens the cervical mucus and prevents sperm from entering the uterus, and it also thins the uterine lining to prevent implantation of a fertilized egg. The hormone can weaken or kill the sperm that get into the uterus. It can stay in place for 5 years. PERMANENT METHODS OF CONTRACEPTION  Female tubal ligation. This is when the woman's fallopian tubes are surgically sealed, tied, or blocked to prevent the egg from traveling to the uterus.  Female sterilization. This is when the female has the tubes that carry sperm tied off (vasectomy).This blocks sperm from entering the vagina during sexual intercourse. After the procedure, the man can still ejaculate fluid (semen). NATURAL PLANNING METHODS  Natural family planning. This is not having sexual intercourse or using a barrier method (condom, diaphragm, cervical cap) on days the woman could become pregnant.  Calendar method. This is keeping track of the length of each menstrual cycle and identifying when you are fertile.  Ovulation method. This is avoiding sexual intercourse during ovulation.  Symptothermal method. This is avoiding sexual intercourse during ovulation, using a  thermometer and ovulation symptoms.  Post-ovulation method. This is timing sexual intercourse after you have ovulated. Regardless of which type or method of contraception you choose, it is important that you use condoms to protect against the transmission of sexually transmitted diseases (STDs). Talk with your caregiver about which form of contraception is most appropriate for you. Document Released: 03/18/2005 Document Revised: 06/10/2011 Document Reviewed: 07/25/2010 [facility] Patient Information 2013 [place], [place].

## 2012-03-19 NOTE — Progress Notes (Signed)
Patient ID: [name], female   DOB: 07/20/1990, 21 y.o.   MRN: [id number] Subjective:     [name] is a 21 y.o. female who presents for a postpartum visit. She is 4 weeks postpartum following a spontaneous vaginal delivery. I have fully reviewed the prenatal and intrapartum course. The delivery was at 40 gestational weeks. Outcome: spontaneous vaginal delivery. Anesthesia: none  Postpartum course has been uncomplicated. Baby's course has been uncomplicated. Baby is feeding by bottle. Bleeding thin lochia. Bowel function is normal. Bladder function is normal. Patient is not sexually active. Contraception method is abstinence. Postpartum depression screening: negative.  The following portions of the patient's history were reviewed and updated as appropriate: allergies, current medications, past family history, past medical history, past social history, past surgical history and problem list.  Review of Systems Pertinent items are noted in HPI.   Objective:    BP 96/66  Pulse 76  Ht 5\' 6"  (1.676 m)  Wt 125 lb 1.6 oz (56.745 kg)  BMI 20.19 kg/m2  LMP 05/30/2011  Breastfeeding? No  General:  alert and no distress     Lungs: clear to auscultation bilaterally  Heart:  regular rate and rhythm, S1, S2 normal, no murmur, click, rub or gallop  Abdomen: normal findings: bowel sounds normal and soft, non-tender   Vulva:  small raw area at introitus  Vagina: normal vagina, no discharge, exudate, lesion, or erythema  Cervix:  no cervical motion tenderness  Corpus: normal size, contour, position, consistency, mobility, non-tender  Adnexa:  normal adnexa and no mass, fullness, tenderness  Rectal Exam: Not performed.        Assessment:    4 week postpartum exam. Pap smear not done at today's visit.   Plan:    1. Contraception: will f/u for Nexplanon 2. Follow up in: 2 weeks or as needed. or next available for nexplanon

## 2012-04-09 ENCOUNTER — Ambulatory Visit: Payer: Medicaid Other | Admitting: Obstetrics & Gynecology

## 2012-04-23 ENCOUNTER — Encounter: Payer: Self-pay | Admitting: Obstetrics and Gynecology

## 2012-04-23 ENCOUNTER — Ambulatory Visit (INDEPENDENT_AMBULATORY_CARE_PROVIDER_SITE_OTHER): Payer: Medicaid Other | Admitting: Obstetrics and Gynecology

## 2012-04-23 VITALS — BP 115/71 | HR 73 | Temp 97.1°F | Wt 123.4 lb

## 2012-04-23 DIAGNOSIS — Z30017 Encounter for initial prescription of implantable subdermal contraceptive: Secondary | ICD-10-CM

## 2012-04-23 LAB — POCT PREGNANCY, URINE: Preg Test, Ur: NEGATIVE

## 2012-04-23 MED ORDER — ETONOGESTREL 68 MG ~~LOC~~ IMPL
68.0000 mg | DRUG_IMPLANT | Freq: Once | SUBCUTANEOUS | Status: AC
  Administered 2012-04-23: 68 mg via SUBCUTANEOUS

## 2012-04-23 NOTE — Patient Instructions (Signed)

## 2012-04-23 NOTE — Addendum Note (Signed)
Addended by: Sherre Lain A on: 04/23/2012 03:39 PM   Modules accepted: Orders

## 2012-04-23 NOTE — Progress Notes (Signed)
Patient ID: [name], female   DOB: 1991-01-14, 22 y.o.   MRN: [id number] 22 yo G2P2 s/p vaginal delivery on 02/20/2012 presenting today for Nexplanon insertion  Patient given informed consent, signed copy in the chart, time out was performed. Pregnancy test was negative. Appropriate time out taken.  Patient's left arm was prepped and draped in the usual sterile fashion.. The ruler used to measure and mark insertion area.  Pt was prepped with alcohol swab and then injected with 2 cc of 1% lidocaine with epinephrine.  Pt was prepped with betadine, Nexplanon removed form packaging,  Device confirmed in needle, then inserted full length of needle and withdrawn per handbook instructions.  Pt insertion site covered with pressure dressing.   Minimal blood loss.  Pt tolerated the procedure well.

## 2012-04-29 ENCOUNTER — Encounter: Payer: Self-pay | Admitting: *Deleted

## 2012-10-19 ENCOUNTER — Emergency Department (INDEPENDENT_AMBULATORY_CARE_PROVIDER_SITE_OTHER)
Admission: EM | Admit: 2012-10-19 | Discharge: 2012-10-19 | Disposition: A | Discharge status: Home / Self Care | Payer: Medicaid Other | Source: Home / Self Care | Attending: Emergency Medicine | Admitting: Emergency Medicine

## 2012-10-19 ENCOUNTER — Other Ambulatory Visit (HOSPITAL_COMMUNITY)
Admission: RE | Admit: 2012-10-19 | Discharge: 2012-10-19 | Disposition: A | Discharge status: Home / Self Care | Payer: Medicaid Other | Source: Ambulatory Visit | Attending: Emergency Medicine | Admitting: Emergency Medicine

## 2012-10-19 ENCOUNTER — Encounter (HOSPITAL_COMMUNITY): Payer: Self-pay | Admitting: *Deleted

## 2012-10-19 DIAGNOSIS — N760 Acute vaginitis: Secondary | ICD-10-CM | POA: Insufficient documentation

## 2012-10-19 DIAGNOSIS — N949 Unspecified condition associated with female genital organs and menstrual cycle: Secondary | ICD-10-CM

## 2012-10-19 DIAGNOSIS — N938 Other specified abnormal uterine and vaginal bleeding: Secondary | ICD-10-CM

## 2012-10-19 DIAGNOSIS — Z113 Encounter for screening for infections with a predominantly sexual mode of transmission: Secondary | ICD-10-CM | POA: Insufficient documentation

## 2012-10-19 LAB — POCT URINALYSIS DIP (DEVICE)
Bilirubin Urine: NEGATIVE
Ketones, ur: NEGATIVE mg/dL
Leukocytes, UA: NEGATIVE
Protein, ur: NEGATIVE mg/dL
Specific Gravity, Urine: 1.025 (ref 1.005–1.030)
pH: 6.5 (ref 5.0–8.0)

## 2012-10-19 MED ORDER — FERROUS SULFATE 324 (65 FE) MG PO TBEC: 1.0000 | DELAYED_RELEASE_TABLET | Freq: Three times a day (TID) | ORAL | Status: DC

## 2012-10-19 MED ORDER — NORGESTIM-ETH ESTRAD TRIPHASIC 0.18/0.215/0.25 MG-35 MCG PO TABS: 1.0000 | ORAL_TABLET | Freq: Every day | ORAL | Status: DC

## 2012-10-19 NOTE — ED Provider Notes (Signed)
Chief Complaint:   Chief Complaint  Patient presents with  . Vaginal Bleeding    History of Present Illness:   [name] is a 22 year old female who comes in today because of abnormal menstrual bleeding. She delivered a healthy child on November 21 of last year. She had a Nexplanon put in. The bleeding went away for about a month or 2, but since then it's recurred. Sometimes it is just a little bit of light spotting and sometimes heavy. She denies any cramping or clotting. She's had no pelvic pain, abdominal pain, although she has had chronic lower back pain going on for months or years. She does not think this is related to the bleeding. She denies any fever, chills, nausea, or vomiting. She's had no urinary symptoms and no vaginal discharge, itching, odor.  Review of Systems:  Other than noted above, the patient denies any of the following symptoms: Systemic:  No fever, chills, sweats, or weight loss. GI:  No abdominal pain, nausea, anorexia, vomiting, diarrhea, constipation, melena or hematochezia. GU:  No dysuria, frequency, urgency, hematuria, vaginal discharge, itching, or abnormal vaginal bleeding. Skin:  No rash or itching.  PMFSH:  Past medical history, family history, social history, meds, and allergies were reviewed.    Physical Exam:   Vital signs:  BP 120/78  Pulse 56  Temp(Src) 98.6 F (37 C) (Oral)  Resp 16  SpO2 100% General:  Alert, oriented and in no distress. Lungs:  Breath sounds clear and equal bilaterally.  No wheezes, rales or rhonchi. Heart:  Regular rhythm.  No gallops or murmers. Abdomen:  Soft, flat and non-distended.  No organomegaly or mass.  No tenderness, guarding or rebound.  Bowel sounds normally active. Pelvic exam:  Normal external genitalia, vaginal and cervical mucosa were normal. There was just a small amount of bleeding in the vaginal vault. No discharge. No pain on cervical motion. Uterus was normal in size and shape and nontender. No adnexal  masses or tenderness. Skin:  Clear, warm and dry.  Labs:   Results for orders placed during the hospital encounter of 10/19/12  POCT URINALYSIS DIP (DEVICE)      Result Value Range   Glucose, UA NEGATIVE  NEGATIVE mg/dL   Bilirubin Urine NEGATIVE  NEGATIVE   Ketones, ur NEGATIVE  NEGATIVE mg/dL   Specific Gravity, Urine 1.025  1.005 - 1.030   Hgb urine dipstick LARGE (*) NEGATIVE   pH 6.5  5.0 - 8.0   Protein, ur NEGATIVE  NEGATIVE mg/dL   Urobilinogen, UA 1.0  0.0 - 1.0 mg/dL   Nitrite NEGATIVE  NEGATIVE   Leukocytes, UA NEGATIVE  NEGATIVE  POCT PREGNANCY, URINE      Result Value Range   Preg Test, Ur NEGATIVE  NEGATIVE    DNA probes for Chlamydia, gonorrhea, Trichomonas, Gardnerella, and candida obtained.  Assessment:  The encounter diagnosis was Dysfunctional uterine bleeding.  I think this is most likely due to her Nexplanon. She was referred to the [facility] practice at [facility] [place] I suggested that she have an ultrasound of her pelvis and discuss with her gynecologist removal of the Nexplanon. In the meantime, she was started on tri-Sprintec. The patient states she prefers a tubal ligation for contraception. She probably has some iron deficiency due to her prolonged bleeding, so I suggested iron supplements.  Plan:   1.  The following meds were prescribed:   Discharge Medication List as of 10/19/2012 12:47 PM    START taking these medications  Details  ferrous sulfate 324 (65 FE) MG TBEC Take 1 tablet (325 mg total) by mouth 3 (three) times daily with meals., Starting 10/19/2012, Until Discontinued, Normal    Norgestimate-Ethinyl Estradiol Triphasic (TRI-SPRINTEC) 0.18/0.215/0.25 MG-35 MCG tablet Take 1 tablet by mouth daily., Starting 10/19/2012, Until Discontinued, Normal       2.  The patient was instructed in symptomatic care and handouts were given. 3.  The patient was told to return if becoming worse in any way, if no better in 3 or 4 days, and given some red flag  symptoms such as extremely heavy bleeding or pain that would indicate earlier return. 4.  Follow up with faculty practice at [facility] within the next week.    Reuben Likes, MD 10/19/12 1320

## 2012-10-19 NOTE — ED Notes (Addendum)
Pt  Has  Implant  Contraception     She reports  continuel vaginal bleeding  For a  Few  Months     She  Also  Reports  Low  Back pain  But ambulated  To  Room with a  Steady  Fluid  Gait   However  Reports  Low  Back pain   Just  Came  From dentist  Had  Some dental work  done

## 2012-10-21 ENCOUNTER — Telehealth (HOSPITAL_COMMUNITY): Payer: Self-pay | Admitting: Emergency Medicine

## 2012-10-21 ENCOUNTER — Telehealth (HOSPITAL_COMMUNITY): Payer: Self-pay | Admitting: *Deleted

## 2012-10-21 MED ORDER — METRONIDAZOLE 500 MG PO TABS: 500.0000 mg | ORAL_TABLET | Freq: Two times a day (BID) | ORAL | Status: DC

## 2012-10-21 NOTE — ED Notes (Addendum)
7/22  GC/Chlamydia neg., Affirm: Candida and Trich neg., Gardnerella pos.  Message to Dr. Lorenz Coaster.  7/23 Order for Flagyl e-prescribed by Dr. Lorenz Coaster. I called pt. and left a message to call. Pt. called back @ 2049.  Pt. verified x 2 and given results.  Pt. told she has a Rx. Of Flagyl @ [facility] on [address].   Pt. instructed to no alcohol while taking this medication.  Pt. voiced understanding. Vassie Moselle 10/21/2012

## 2012-10-21 NOTE — ED Notes (Signed)
Patient's DNA probe came back positive for Gardnerella. Will send in a prescription for Flagyl 500 mg twice a day for one week.  Reuben Likes, MD 10/21/12 [phone]

## 2012-10-21 NOTE — Telephone Encounter (Signed)
Message copied by Reuben Likes on Wed Oct 21, 2012  7:59 AM ------      Message from: Vassie Moselle      Created: Tue Oct 20, 2012 11:14 PM      Regarding: labs       Gardnerella pos. Rest of labs neg. Did not see order.      Vassie Moselle      10/20/2012       ------

## 2012-12-13 ENCOUNTER — Emergency Department (HOSPITAL_COMMUNITY)
Admission: EM | Admit: 2012-12-13 | Discharge: 2012-12-14 | Disposition: A | Discharge status: Home / Self Care | Payer: Medicaid Other | Attending: Emergency Medicine | Admitting: Emergency Medicine

## 2012-12-13 ENCOUNTER — Encounter (HOSPITAL_COMMUNITY): Payer: Self-pay | Admitting: *Deleted

## 2012-12-13 DIAGNOSIS — Z87891 Personal history of nicotine dependence: Secondary | ICD-10-CM | POA: Insufficient documentation

## 2012-12-13 DIAGNOSIS — R5381 Other malaise: Secondary | ICD-10-CM | POA: Insufficient documentation

## 2012-12-13 DIAGNOSIS — N926 Irregular menstruation, unspecified: Secondary | ICD-10-CM | POA: Insufficient documentation

## 2012-12-13 DIAGNOSIS — R42 Dizziness and giddiness: Secondary | ICD-10-CM | POA: Insufficient documentation

## 2012-12-13 DIAGNOSIS — Z8619 Personal history of other infectious and parasitic diseases: Secondary | ICD-10-CM | POA: Insufficient documentation

## 2012-12-13 DIAGNOSIS — D649 Anemia, unspecified: Secondary | ICD-10-CM

## 2012-12-13 DIAGNOSIS — Z3202 Encounter for pregnancy test, result negative: Secondary | ICD-10-CM | POA: Insufficient documentation

## 2012-12-13 DIAGNOSIS — R5383 Other fatigue: Secondary | ICD-10-CM

## 2012-12-13 NOTE — ED Notes (Signed)
Pt c/o of feeling tired and generally weak x 2 weeks; pt denies N/V/D; pt states that she is dizzy sometimes but denies currently

## 2012-12-13 NOTE — ED Provider Notes (Signed)
CSN: [id number]     Arrival date & time 12/13/12  2324 History   First MD Initiated Contact with Patient 12/13/12 2336     Chief Complaint  Patient presents with  . Fatigue   (Consider location/radiation/quality/duration/timing/severity/associated sxs/prior Treatment) HPI Pt is a [place] female with hx of anemia c/o 2 week hx of fatigue, associated with generalized weakness and lightheadedness. Pt states lightheadedness worse when she stands too quickly.  Reports hx of anemia but states she is not on any medication for anemia and does not recall last time her blood was checked.  States she has been eating and drinking daily but states "I could be eating better and drinking more water." Does report increased stress in life due to 64mo at home, sleeps about 6hrs per night, sometimes less. Denies fever, n/v/d, cough, sore throat, SOB, urinary symptoms. LMP earlier this month but reports irregular cycles due to her birth control.  Past Medical History  Diagnosis Date  . Anemia   . Chlamydia 2012   Past Surgical History  Procedure Laterality Date  . Cesarean section    . Tooth extraction  2010   Family History  Problem Relation Age of Onset  . Cancer Maternal Aunt   . Anesthesia problems Neg Hx   . Hypotension Neg Hx   . Malignant hyperthermia Neg Hx   . Pseudochol deficiency Neg Hx   . Other Neg Hx    History  Substance Use Topics  . Smoking status: Former Smoker -- 1.00 packs/day    Types: Cigarettes    Quit date: 07/12/2011  . Smokeless tobacco: Never Used  . Alcohol Use: No   OB History   Grav Para Term Preterm Abortions TAB SAB Ect Mult Living   2 2 2       2      Review of Systems  Constitutional: Positive for fatigue.  Respiratory: Negative for cough and shortness of breath.   Cardiovascular: Negative for chest pain.  Gastrointestinal: Negative for nausea, vomiting, diarrhea and blood in stool.  Genitourinary: Positive for menstrual problem ( irregular menstraul cycles.).  Negative for dysuria, urgency, hematuria, flank pain, vaginal bleeding, vaginal discharge, vaginal pain and pelvic pain.  All other systems reviewed and are negative.    Allergies  Review of patient's allergies indicates no known allergies.  Home Medications   Current Outpatient Rx  Name  Route  Sig  Dispense  Refill  . ibuprofen (ADVIL,MOTRIN) 800 MG tablet   Oral   Take 1 tablet (800 mg total) by mouth every 8 (eight) hours as needed for pain.   30 tablet   0    BP 104/60  Pulse 64  Temp(Src) 98.4 F (36.9 C) (Oral)  Resp 16  Ht 5\' 7"  (1.702 m)  Wt 110 lb (49.896 kg)  BMI 17.22 kg/m2  SpO2 100% Physical Exam  Nursing note and vitals reviewed. Constitutional: She is oriented to person, place, and time. She appears well-developed and well-nourished. No distress.  HENT:  Head: Normocephalic and atraumatic.  Right Ear: Hearing, tympanic membrane, external ear and ear canal normal.  Left Ear: Hearing, tympanic membrane, external ear and ear canal normal.  Nose: Nose normal.  Mouth/Throat: Uvula is midline, oropharynx is clear and moist and mucous membranes are normal. No oropharyngeal exudate, posterior oropharyngeal edema, posterior oropharyngeal erythema or tonsillar abscesses.  Eyes: Conjunctivae and EOM are normal. Pupils are equal, round, and reactive to light. Right eye exhibits no discharge. Left eye exhibits no discharge. No scleral  icterus.  Neck: Normal range of motion. Neck supple.  Cardiovascular: Normal rate, regular rhythm and normal heart sounds.   Pulmonary/Chest: Effort normal and breath sounds normal. No respiratory distress. She has no wheezes. She has no rales. She exhibits no tenderness.  Abdominal: Soft. Bowel sounds are normal. She exhibits no distension and no mass. There is no tenderness. There is no rebound and no guarding.  Musculoskeletal: Normal range of motion.  Lymphadenopathy:    She has no cervical adenopathy.  Neurological: She is alert and  oriented to person, place, and time. She has normal strength. No cranial nerve deficit or sensory deficit. She displays a negative Romberg sign. Coordination normal. GCS eye subscore is 4. GCS verbal subscore is 5. GCS motor subscore is 6.  CN II-XII in tact, no focal deficit, nl finger to nose coordination. Nl sensation, 5/5 strength in all major muscle groups. Neg romberg and nl gait.    Skin: Skin is warm and dry. She is not diaphoretic.    ED Course  Procedures (including critical care time) Labs Review Labs Reviewed  PREGNANCY, URINE   Imaging Review No results found.  MDM   1. Fatigue   2. Anemia     Likely due to chronic anemia. Urine preg: negative. Vitals: unremarkable.    Will discharge pt home and have her f/u with [facility] info provided. Return precautions given. Provided pt education on fatigue.  Pt verbalized understanding and agreement with tx plan. Vitals: unremarkable. Discharged in stable condition.    Discussed pt with attending during ED encounter.    Junius Finner, PA-C 12/14/12 [phone]

## 2012-12-14 LAB — PREGNANCY, URINE: Preg Test, Ur: NEGATIVE

## 2012-12-14 MED ORDER — SODIUM CHLORIDE 0.9 % IV BOLUS (SEPSIS): 1000.0000 mL | INTRAVENOUS | Status: DC

## 2012-12-14 NOTE — ED Notes (Signed)
MD at bedside. 

## 2012-12-14 NOTE — ED Provider Notes (Signed)
Medical screening examination/treatment/procedure(s) were performed by non-physician practitioner and as supervising physician I was immediately available for consultation/collaboration.  Jasmine Awe, MD 12/14/12 [phone]

## 2012-12-14 NOTE — ED Notes (Signed)
C/o lightheaded and weakness x 2 weeks.

## 2013-02-18 ENCOUNTER — Other Ambulatory Visit (HOSPITAL_COMMUNITY)
Admission: RE | Admit: 2013-02-18 | Discharge: 2013-02-18 | Disposition: A | Discharge status: Home / Self Care | Payer: Medicaid Other | Source: Ambulatory Visit | Attending: Family Medicine | Admitting: Family Medicine

## 2013-02-18 ENCOUNTER — Emergency Department (HOSPITAL_COMMUNITY)
Admission: EM | Admit: 2013-02-18 | Discharge: 2013-02-18 | Disposition: A | Discharge status: Home / Self Care | Payer: Medicaid Other | Source: Home / Self Care | Attending: Family Medicine | Admitting: Family Medicine

## 2013-02-18 ENCOUNTER — Encounter (HOSPITAL_COMMUNITY): Payer: Self-pay | Admitting: Emergency Medicine

## 2013-02-18 DIAGNOSIS — M549 Dorsalgia, unspecified: Secondary | ICD-10-CM

## 2013-02-18 DIAGNOSIS — Z113 Encounter for screening for infections with a predominantly sexual mode of transmission: Secondary | ICD-10-CM | POA: Insufficient documentation

## 2013-02-18 DIAGNOSIS — N949 Unspecified condition associated with female genital organs and menstrual cycle: Secondary | ICD-10-CM

## 2013-02-18 DIAGNOSIS — N760 Acute vaginitis: Secondary | ICD-10-CM | POA: Insufficient documentation

## 2013-02-18 LAB — POCT URINALYSIS DIP (DEVICE)
Bilirubin Urine: NEGATIVE
Glucose, UA: NEGATIVE mg/dL
Ketones, ur: NEGATIVE mg/dL
Nitrite: NEGATIVE

## 2013-02-18 MED ORDER — NAPROXEN 500 MG PO TABS: 500.0000 mg | ORAL_TABLET | Freq: Two times a day (BID) | ORAL | Status: DC

## 2013-02-18 NOTE — ED Notes (Signed)
Pt c/o lower back pain onset 2 weeks Denies inj/trauma, strenuous activity, urinary sxs, abd pain Pain is constant She is alert w/no signs of acute distress... She is carrying her 71 month old son.

## 2013-02-18 NOTE — ED Provider Notes (Signed)
CSN: [id number]     Arrival date & time 02/18/13  1717 History   First MD Initiated Contact with Patient 02/18/13 1848     Chief Complaint  Patient presents with  . Back Pain   (Consider location/radiation/quality/duration/timing/severity/associated sxs/prior Treatment) HPI Comments: 13f presents c/o back pain.  She has back pain for years that has been getting worse in the past few months.  No acute changes, no recent injury.  No loss of bowel or bladder control.  No extremity numbness or weakness. Also she would like STD testing. She has no symptoms of STDs, she does think she should get tested. She has no known exposure to STDs   Past Medical History  Diagnosis Date  . Anemia   . Chlamydia 2012   Past Surgical History  Procedure Laterality Date  . Cesarean section    . Tooth extraction  2010   Family History  Problem Relation Age of Onset  . Cancer Maternal Aunt   . Anesthesia problems Neg Hx   . Hypotension Neg Hx   . Malignant hyperthermia Neg Hx   . Pseudochol deficiency Neg Hx   . Other Neg Hx    History  Substance Use Topics  . Smoking status: Former Smoker -- 1.00 packs/day    Types: Cigarettes    Quit date: 07/12/2011  . Smokeless tobacco: Never Used  . Alcohol Use: No   OB History   Grav Para Term Preterm Abortions TAB SAB Ect Mult Living   2 2 2       2      Review of Systems  Constitutional: Negative for fever and chills.  Eyes: Negative for visual disturbance.  Respiratory: Negative for cough and shortness of breath.   Cardiovascular: Negative for chest pain, palpitations and leg swelling.  Gastrointestinal: Negative for nausea, vomiting and abdominal pain.  Endocrine: Negative for polydipsia and polyuria.  Genitourinary: Negative for dysuria, urgency and frequency.  Musculoskeletal: Positive for back pain. Negative for arthralgias and myalgias.  Skin: Negative for rash.  Neurological: Negative for dizziness, weakness and light-headedness.     Allergies  Review of patient's allergies indicates no known allergies.  Home Medications   Current Outpatient Rx  Name  Route  Sig  Dispense  Refill  . ibuprofen (ADVIL,MOTRIN) 800 MG tablet   Oral   Take 1 tablet (800 mg total) by mouth every 8 (eight) hours as needed for pain.   30 tablet   0   . naproxen (NAPROSYN) 500 MG tablet   Oral   Take 1 tablet (500 mg total) by mouth 2 (two) times daily.   60 tablet   0    BP 112/71  Pulse 77  Temp(Src) 98.5 F (36.9 C) (Oral)  Resp 16  SpO2 100% Physical Exam  Nursing note and vitals reviewed. Constitutional: She is oriented to person, place, and time. Vital signs are normal. She appears well-developed and well-nourished. No distress.  Thin habitus  HENT:  Head: Normocephalic and atraumatic.  Pulmonary/Chest: Effort normal. No respiratory distress.  Musculoskeletal:       Lumbar back: Normal.  Neurological: She is alert and oriented to person, place, and time. She has normal strength. Coordination normal.  Skin: Skin is warm and dry. No rash noted. She is not diaphoretic.  Psychiatric: She has a normal mood and affect. Judgment normal.    ED Course  Procedures (including critical care time) Labs Review Labs Reviewed  POCT URINALYSIS DIP (DEVICE) - Abnormal; Notable for the following:  Hgb urine dipstick LARGE (*)    Leukocytes, UA SMALL (*)    All other components within normal limits  URINE CULTURE  RPR  HIV ANTIBODY (ROUTINE TESTING)  POCT PREGNANCY, URINE  URINE CYTOLOGY ANCILLARY ONLY   Imaging Review No results found.    MDM   1. Back pain    No red flags.  Treating with Naprosyn. STD tests sent.   Meds ordered this encounter  Medications  . naproxen (NAPROSYN) 500 MG tablet    Sig: Take 1 tablet (500 mg total) by mouth 2 (two) times daily.    Dispense:  60 tablet    Refill:  0    Order Specific Question:  Supervising Provider    Answer:  Bradd Canary D [[id number]]     Graylon Good,  PA-C 02/19/13 2047

## 2013-02-19 LAB — RPR: RPR Ser Ql: NONREACTIVE

## 2013-02-20 LAB — URINE CULTURE: Colony Count: 50000

## 2013-02-22 ENCOUNTER — Telehealth (HOSPITAL_COMMUNITY): Payer: Self-pay | Admitting: *Deleted

## 2013-02-22 NOTE — ED Notes (Signed)
Pt. called for lab results. Pt. verified x 2 and given results.  Pt. told that the urine culture is inconclusive. If she still has symptoms of a UTI then she should get it rechecked.  I reviewed UTI symptoms with her.  Pt. denies any UTI symptoms. [name] 02/22/2013

## 2013-03-03 NOTE — ED Provider Notes (Signed)
Medical screening examination/treatment/procedure(s) were performed by resident physician or non-physician practitioner and as supervising physician I was immediately available for consultation/collaboration.   Barkley Bruns MD.   Linna Hoff, MD 03/03/13 [phone]

## 2013-08-21 IMAGING — US US OB COMP +14 WK
1 series · 12 of 28 positions shown · non-contrast
Comparison: none

[Series 1: us ob comp +14 wk · 12 of 70 slices shown]
[im 3/70]
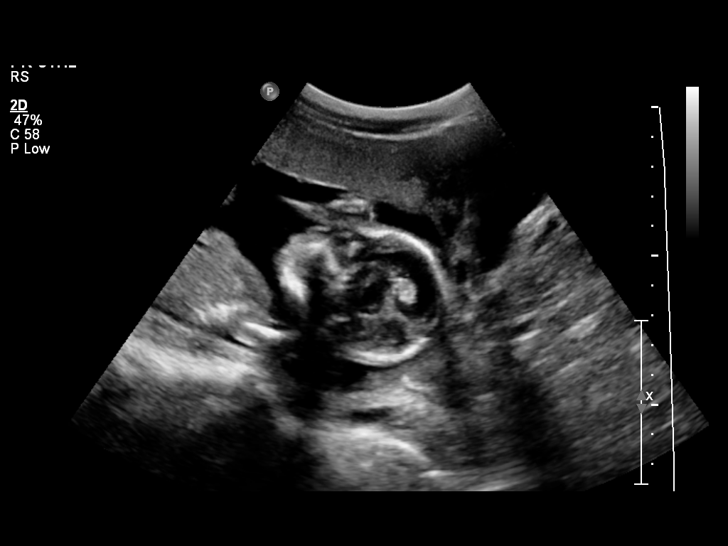
[im 8/70]
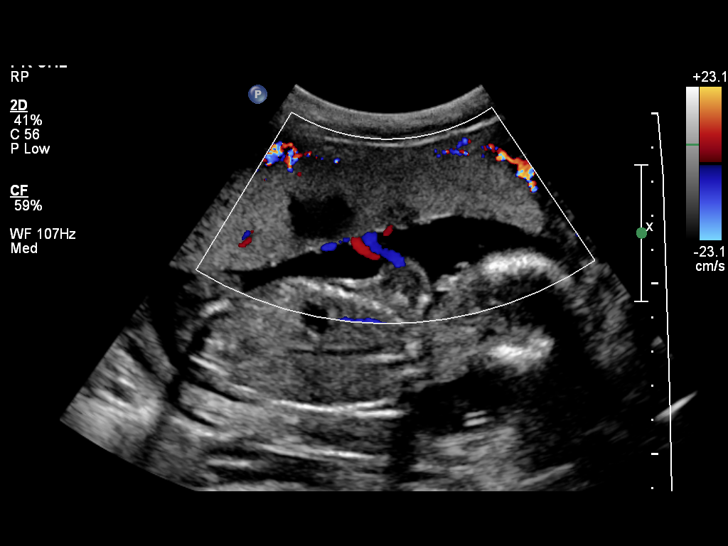
[im 13/70]
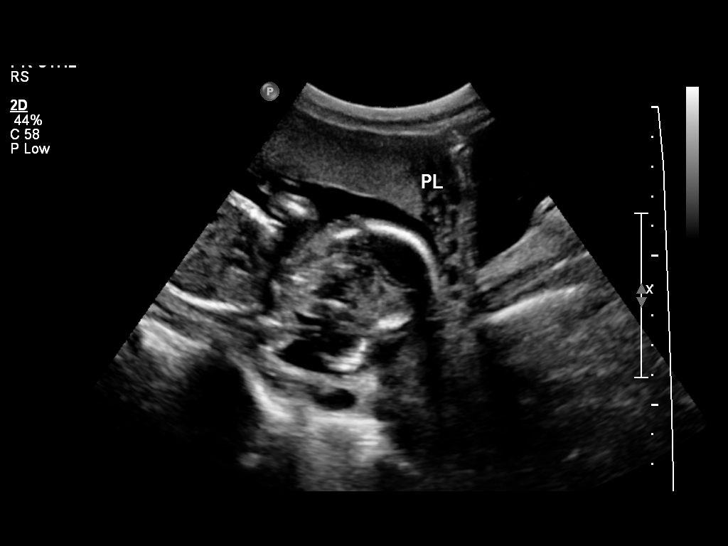
[im 21/70]
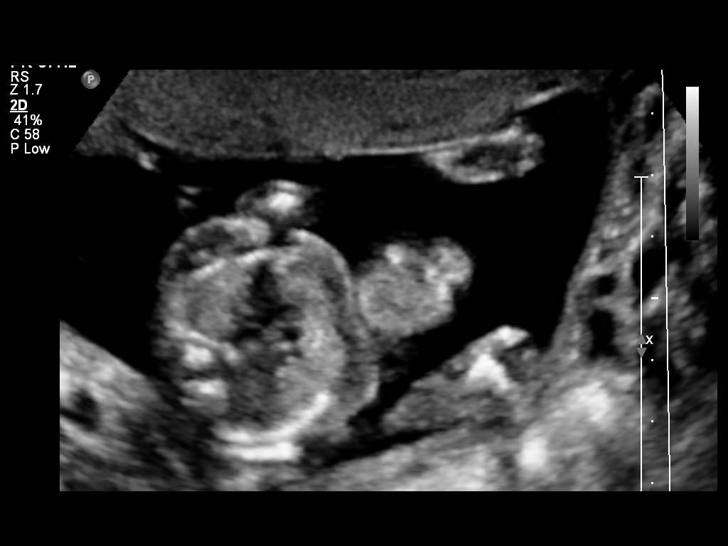
[im 26/70]
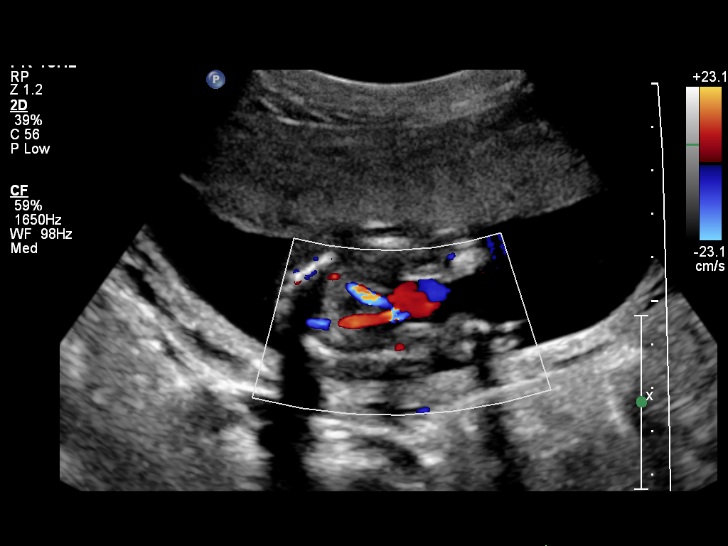
[im 31/70]
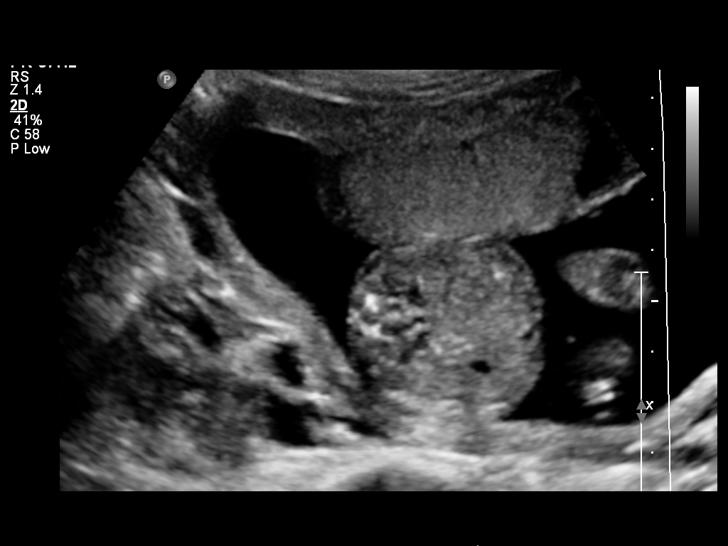
[im 39/70]
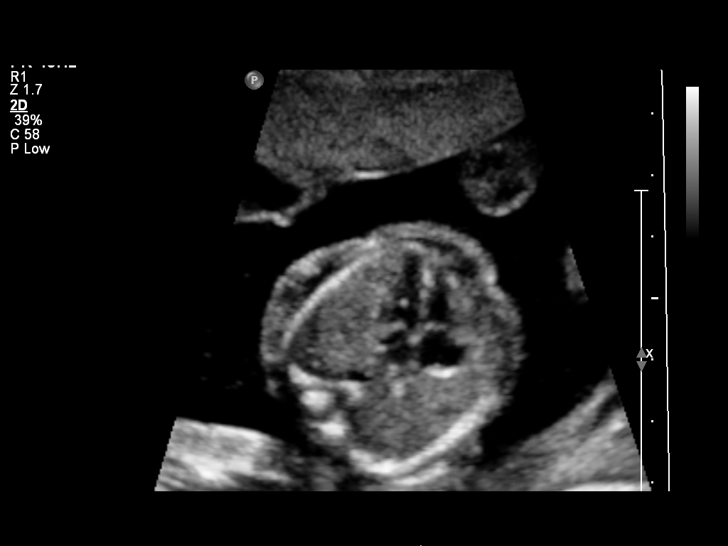
[im 44/70]
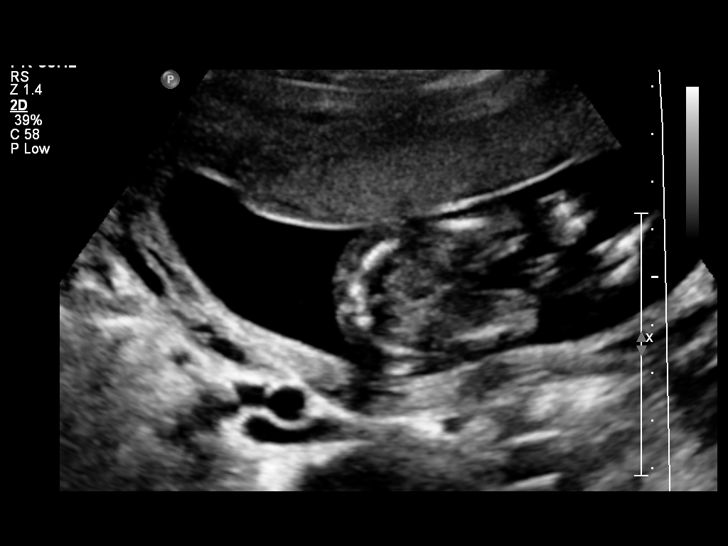
[im 49/70]
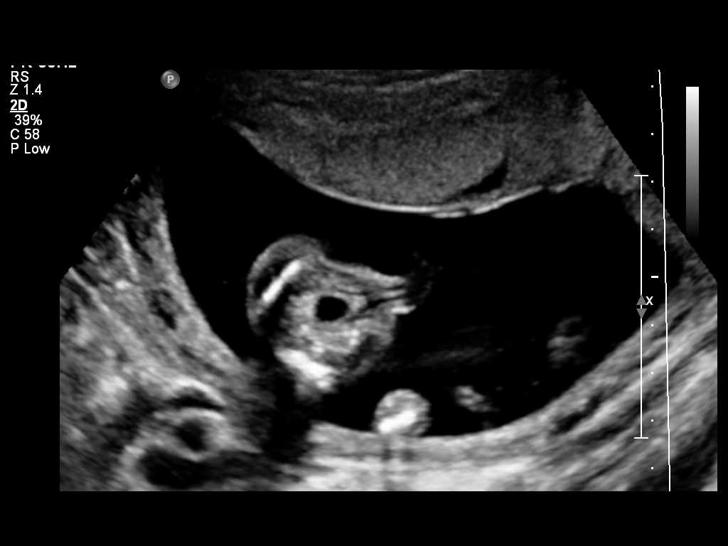
[im 57/70]
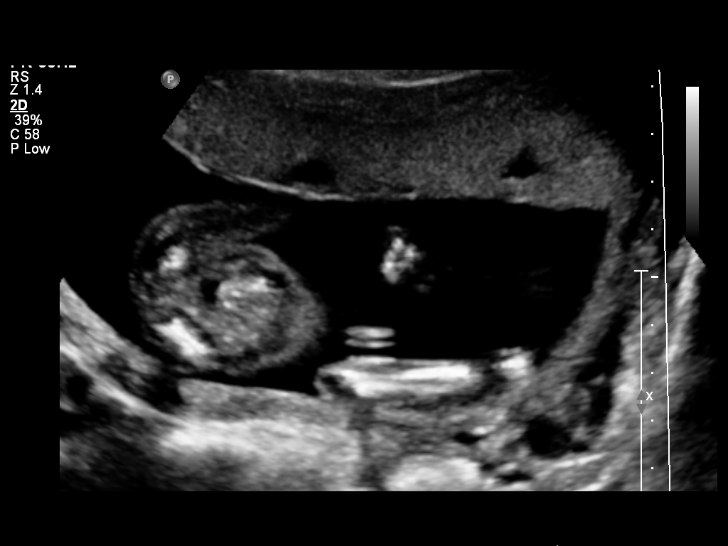
[im 62/70]
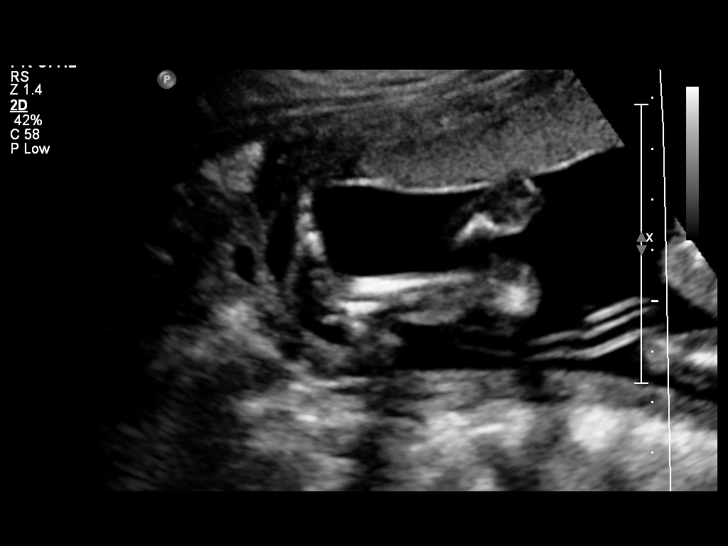
[im 67/70]
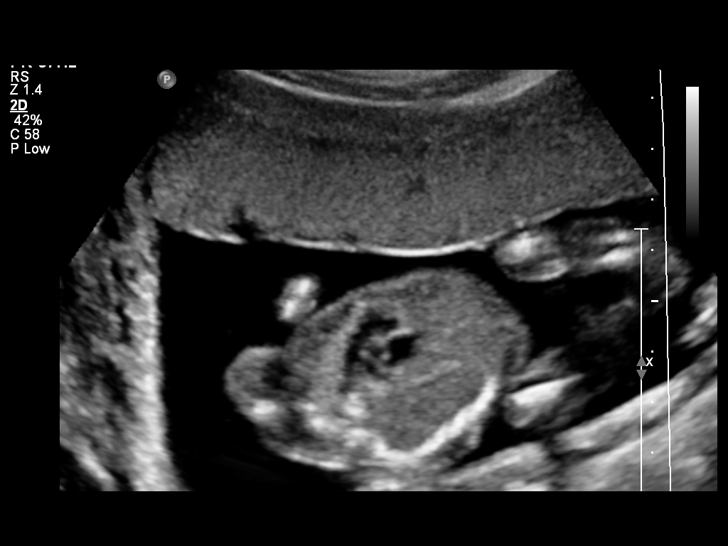

[12 of 28 positions shown; findings below may reference images not displayed]

OBSTETRICS REPORT
                      (Signed Final 09/26/2011 [DATE])

Procedures

 US OB COMP + 14 WK                                    76805.1
Indications

 Pain - Abdominal/Pelvic
 Routine fetal anatomic survey
 Unsure of LMP;  Establish Gestational [AGE]
Fetal Evaluation

 Fetal Heart Rate:  143                         bpm
 Cardiac Activity:  Observed
 Presentation:      Cephalic
 Placenta:          Posterior, above cervical
                    os
 P. Cord            Visualized
 Insertion:

 Amniotic Fluid
 AFI FV:      Subjectively within normal limits
                                             Larg Pckt:     4.2  cm
Biometry

 BPD:       41  mm    G. Age:   18w 3d                CI:        70.17   70 - 86
                                                      FL/HC:      17.4   15.8 -
                                                                         18
 HC:     156.1  mm    G. Age:   18w 4d       48  %    HC/AC:      1.22   1.07 -

 AC:     128.4  mm    G. Age:   18w 3d       46  %    FL/BPD:
 FL:      27.2  mm    G. Age:   18w 2d       40  %    FL/AC:      21.2   20 - 24
 NFT:        3  mm

 Est. FW:     237  gm      0 lb 8 oz     45  %
Gestational Age

 LMP:           17w 0d       Date:   05/30/11                 EDD:   03/05/12
 U/S Today:     18w 3d                                        EDD:   02/24/12
 Best:          18w 3d    Det. By:   U/S (09/26/11)           EDD:   02/24/12
Anatomy

 Cranium:           Appears normal      Aortic Arch:       Basic anatomy
                                                           exam per order
 Fetal Cavum:       Appears normal      Ductal Arch:       Basic anatomy
                                                           exam per order
 Ventricles:        Appears normal      Diaphragm:         Appears normal
 Choroid Plexus:    Appears normal      Stomach:           Appears
                                                           normal, left
                                                           sided
 Cerebellum:        Appears normal      Abdomen:           Appears normal
 Posterior Fossa:   Appears normal      Abdominal Wall:    Appears nml
                                                           (cord insert,
                                                           abd wall)
 Nuchal Fold:       Appears normal      Cord Vessels:      Appears normal
                    (neck, nuchal                          (3 vessel cord)
                    fold)
 Face:              Lips appear         Kidneys:           Appear normal
                    normal
 Heart:             Echogenic           Bladder:           Appears normal
                    focus in LV
 RVOT:              Appears normal      Spine:             Appears normal
 LVOT:              Appears normal      Limbs:             Four extremities
                                                           seen

 Other:     Male gender. Technically difficult due to fetal position.
Cervix Uterus Adnexa

 Cervical Length:   3.7       cm

 Cervix:       Closed. Normal appearance by transabdominal scan.

 Adnexa:     No abnormality visualized.
Impression

 Single intrauterine gestation demonstrating an estimated
 gestational age by ultrasound of 18w 3d. Fetal parameters
 correlate with this composite EGA.

 Visualized fetal anatomy appears normal. An echogenic
 intracardiac focus is seen and this is considered a soft
 marker for Down Syndrome. No other soft markers are noted.
 No focal placental abnormalities are seen.

 Subjectively and quantitatively normal amniotic fluid volume.

 Normal cervical length and appearance.

 questions or concerns.

## 2013-08-30 NOTE — Clinical Documentation Improvement (Signed)
Chart accessed for purpose of chart audit in reference to [place] [employer].  

## 2013-09-29 ENCOUNTER — Emergency Department (INDEPENDENT_AMBULATORY_CARE_PROVIDER_SITE_OTHER)
Admission: EM | Admit: 2013-09-29 | Discharge: 2013-09-29 | Disposition: A | Discharge status: Home / Self Care | Payer: Medicaid Other | Source: Home / Self Care | Attending: Emergency Medicine | Admitting: Emergency Medicine

## 2013-09-29 ENCOUNTER — Encounter (HOSPITAL_COMMUNITY): Payer: Self-pay | Admitting: Emergency Medicine

## 2013-09-29 ENCOUNTER — Other Ambulatory Visit (HOSPITAL_COMMUNITY)
Admission: RE | Admit: 2013-09-29 | Discharge: 2013-09-29 | Disposition: A | Discharge status: Home / Self Care | Payer: Self-pay | Source: Ambulatory Visit | Attending: Emergency Medicine | Admitting: Emergency Medicine

## 2013-09-29 DIAGNOSIS — N760 Acute vaginitis: Secondary | ICD-10-CM | POA: Insufficient documentation

## 2013-09-29 DIAGNOSIS — N920 Excessive and frequent menstruation with regular cycle: Secondary | ICD-10-CM

## 2013-09-29 DIAGNOSIS — N921 Excessive and frequent menstruation with irregular cycle: Secondary | ICD-10-CM

## 2013-09-29 DIAGNOSIS — Z113 Encounter for screening for infections with a predominantly sexual mode of transmission: Secondary | ICD-10-CM | POA: Insufficient documentation

## 2013-09-29 LAB — POCT I-STAT, CHEM 8
BUN: 7 mg/dL (ref 6–23)
CHLORIDE: 104 meq/L (ref 96–112)
Calcium, Ion: 1.25 mmol/L — ABNORMAL HIGH (ref 1.12–1.23)
Creatinine, Ser: 0.7 mg/dL (ref 0.50–1.10)
Glucose, Bld: 77 mg/dL (ref 70–99)
HCT: 43 % (ref 36.0–46.0)
Hemoglobin: 14.6 g/dL (ref 12.0–15.0)
Potassium: 3.7 mEq/L (ref 3.7–5.3)
SODIUM: 141 meq/L (ref 137–147)
TCO2: 23 mmol/L (ref 0–100)

## 2013-09-29 LAB — POCT URINALYSIS DIP (DEVICE)
Bilirubin Urine: NEGATIVE
Glucose, UA: NEGATIVE mg/dL
Ketones, ur: NEGATIVE mg/dL
NITRITE: NEGATIVE
PH: 7 (ref 5.0–8.0)
Protein, ur: NEGATIVE mg/dL
Specific Gravity, Urine: 1.025 (ref 1.005–1.030)
Urobilinogen, UA: 2 mg/dL — ABNORMAL HIGH (ref 0.0–1.0)

## 2013-09-29 MED ORDER — MEDROXYPROGESTERONE ACETATE 10 MG PO TABS: 10.0000 mg | ORAL_TABLET | Freq: Every day | ORAL | Status: DC

## 2013-09-29 NOTE — ED Provider Notes (Signed)
Chief Complaint   Chief Complaint  Patient presents with  . Vaginal Bleeding    History of Present Illness   [name] is a 23 year old female who has had intermittent vaginal bleeding off and on since the birth of her child 19 months ago. She delivered in November of 2013. She states her bleeding was just about stopping. In February of 2014 she has an Implanad placed in her left upper arm. Ever since then she's had intermittent bleeding on almost every day, some days heavy and some days light. She denies any pelvic or abdominal pain. She thinks she may have a slight discharge and some odor. She's had some headaches but denies any dizziness. The patient is sexually active and she has some discomfort with intercourse and if causes the bleeding to get worse.  Review of Systems   Other than as noted above, the patient denies any of the following symptoms: Systemic:  No fever or chills GI:  No abdominal pain, nausea, vomiting, diarrhea, constipation, melena or hematochezia. GU:  No dysuria, frequency, urgency, hematuria, vaginal discharge, itching, or abnormal vaginal bleeding.  PMFSH   Past medical history, family history, social history, meds, and allergies were reviewed.    Physical Examination    Vital signs:  BP 99/45  Pulse 61  Temp(Src) 98.5 F (36.9 C) (Oral)  Resp 18  SpO2 100% General:  Alert, oriented and in no distress. Lungs:  Breath sounds clear and equal bilaterally.  No wheezes, rales or rhonchi. Heart:  Regular rhythm.  No gallops or murmers. Abdomen:  Soft, flat and non-distended.  No organomegaly or mass.  No tenderness, guarding or rebound.  Bowel sounds normally active. Pelvic exam:  Normal external genitalia, vaginal and cervical mucosa were normal, there was a small amount of blood in the vaginal vault. No pain on cervical motion. Uterus was normal in size and shape and nontender. No adnexal masses or tenderness.  DNA probes for gonorrhea, Chlamydia,  Trichomonas, Gardnerella, Candida were obtained. Skin:  Clear, warm and dry.  Labs   Results for orders placed during the hospital encounter of 09/29/13  POCT URINALYSIS DIP (DEVICE)      Result Value Ref Range   Glucose, UA NEGATIVE  NEGATIVE mg/dL   Bilirubin Urine NEGATIVE  NEGATIVE   Ketones, ur NEGATIVE  NEGATIVE mg/dL   Specific Gravity, Urine 1.025  1.005 - 1.030   Hgb urine dipstick LARGE (*) NEGATIVE   pH 7.0  5.0 - 8.0   Protein, ur NEGATIVE  NEGATIVE mg/dL   Urobilinogen, UA 2.0 (*) 0.0 - 1.0 mg/dL   Nitrite NEGATIVE  NEGATIVE   Leukocytes, UA SMALL (*) NEGATIVE  POCT I-STAT, CHEM 8      Result Value Ref Range   Sodium 141  137 - 147 mEq/L   Potassium 3.7  3.7 - 5.3 mEq/L   Chloride 104  96 - 112 mEq/L   BUN 7  6 - 23 mg/dL   Creatinine, Ser 4.030.70  0.50 - 1.10 mg/dL   Glucose, Bld 77  70 - 99 mg/dL   Calcium, Ion 4.741.25 (*) 1.12 - 1.23 mmol/L   TCO2 23  0 - 100 mmol/L   Hemoglobin 14.6  12.0 - 15.0 g/dL   HCT 25.943.0  56.336.0 - 87.546.0 %    Assessment   The encounter diagnosis was Menorrhagia with irregular cycle.  No evidence of PID, fibroid tumors, ovarian cysts, or pregnancy. Her pregnancy test was negative. I think this is dysfunctional uterine bleeding,  probably due to the Implanad. Suggested she return to her gynecologist and discuss having this removed and perhaps trying a Mirena instead.       Plan    1.  Meds:  The following meds were prescribed:   Discharge Medication List as of 09/29/2013  1:51 PM    START taking these medications   Details  medroxyPROGESTERone (PROVERA) 10 MG tablet Take 1 tablet (10 mg total) by mouth daily., Starting 09/29/2013, Until Discontinued, Normal        2.  Patient Education/Counseling:  The patient was given appropriate handouts, self care instructions, and instructed in symptomatic relief.    3.  Follow up:  The patient was told to follow up here if no better in 3 to 4 days, or sooner if becoming worse in any way, and given some  red flag symptoms such as worsening pain, fever, persistent vomiting, or heavy vaginal bleeding which would prompt immediate return.       Reuben Likesavid C Raymona Boss, MD 09/29/13 [phone]

## 2013-09-29 NOTE — ED Notes (Signed)
States she has never stopped bleeding since birth 01-2012. Was here 11-14 for same symptoms. When asked what her MD said about her continued bleeding since birth of her child in 01-2012, she said " I guess I'm just trippin', I thought I didn't need that test, so i didn't go. I thought i made an appointment" Patient did not look this writer in eyes when she discussed her history. NAD

## 2013-09-29 NOTE — Discharge Instructions (Signed)
Consider removing Implanad and try a Mirena.   Abnormal Uterine Bleeding Abnormal uterine bleeding can affect women at various stages in life, including teenagers, women in their reproductive years, pregnant women, and women who have reached menopause. Several kinds of uterine bleeding are considered abnormal, including:  Bleeding or spotting between periods.   Bleeding after sexual intercourse.   Bleeding that is heavier or more than normal.   Periods that last longer than usual.  Bleeding after menopause.  Many cases of abnormal uterine bleeding are minor and simple to treat, while others are more serious. Any type of abnormal bleeding should be evaluated by your health care provider. Treatment will depend on the cause of the bleeding. HOME CARE INSTRUCTIONS Monitor your condition for any changes. The following actions may help to alleviate any discomfort you are experiencing:  Avoid the use of tampons and douches as directed by your health care provider.  Change your pads frequently. You should get regular pelvic exams and Pap tests. Keep all follow-up appointments for diagnostic tests as directed by your health care provider.  SEEK MEDICAL CARE IF:   Your bleeding lasts more than 1 week.   You feel dizzy at times.  SEEK IMMEDIATE MEDICAL CARE IF:   You pass out.   You are changing pads every 15 to 30 minutes.   You have abdominal pain.  You have a fever.   You become sweaty or weak.   You are passing large blood clots from the vagina.   You start to feel nauseous and vomit. MAKE SURE YOU:   Understand these instructions.  Will watch your condition.  Will get help right away if you are not doing well or get worse. Document Released: 03/18/2005 Document Revised: 03/23/2013 Document Reviewed: 10/15/2012 [facility] Patient Information 2015 [employer] [place]. This information is not intended to replace advice given to you by your health care provider. Make  sure you discuss any questions you have with your health care provider.

## 2013-09-30 NOTE — ED Notes (Signed)
GC/Chlamydia neg., Affirm: Candida and Gardnerella neg., Trich pos.  Message sent to Dr. Lorenz CoasterKeller. Vassie MoselleYork, Kailin Leu M 09/30/2013

## 2013-10-01 ENCOUNTER — Telehealth (HOSPITAL_COMMUNITY): Payer: Self-pay | Admitting: Emergency Medicine

## 2013-10-01 ENCOUNTER — Telehealth (HOSPITAL_COMMUNITY): Payer: Self-pay | Admitting: *Deleted

## 2013-10-01 MED ORDER — METRONIDAZOLE 500 MG PO TABS: ORAL_TABLET | ORAL | Status: DC

## 2013-10-01 NOTE — ED Notes (Addendum)
Her DNA probe came back positive for Trichomonas. She was not treated. We'll need to send in a prescription to her pharmacy for Flagyl 500 mg, #4 tablets, take all at 1 time. We'll need to call her and have her inform her partner or partners and have them tested and treated as well. [facility] [address]  Reuben Likesavid C Jocelin Schuelke, MD 10/01/13 [phone]  Reuben Likesavid C Genola Yuille, MD 10/01/13 [phone]

## 2013-10-04 NOTE — ED Notes (Signed)
Pt. called back.  Pt. verified x 2 and given results.  Pt. told she needs Flagyl for Trich and where to pick up her Rx. Instructed to take with food.  Pt. instructed to notify her partner to be treated with Flagyl, no sex until she has finished her medication and her partner has been treated and to practice safe sex. [facility], by appointment. Vassie MoselleYork, Charrise Lardner M 10/04/2013

## 2013-10-30 ENCOUNTER — Emergency Department (INDEPENDENT_AMBULATORY_CARE_PROVIDER_SITE_OTHER)
Admission: EM | Admit: 2013-10-30 | Discharge: 2013-10-30 | Disposition: A | Discharge status: Home / Self Care | Payer: Self-pay | Source: Home / Self Care | Attending: Emergency Medicine | Admitting: Emergency Medicine

## 2013-10-30 ENCOUNTER — Encounter (HOSPITAL_COMMUNITY): Payer: Self-pay | Admitting: Emergency Medicine

## 2013-10-30 DIAGNOSIS — S335XXA Sprain of ligaments of lumbar spine, initial encounter: Secondary | ICD-10-CM

## 2013-10-30 DIAGNOSIS — S39012A Strain of muscle, fascia and tendon of lower back, initial encounter: Secondary | ICD-10-CM

## 2013-10-30 MED ORDER — MELOXICAM 15 MG PO TABS: 15.0000 mg | ORAL_TABLET | Freq: Every day | ORAL | Status: DC

## 2013-10-30 MED ORDER — CYCLOBENZAPRINE HCL 5 MG PO TABS: 5.0000 mg | ORAL_TABLET | Freq: Three times a day (TID) | ORAL | Status: DC | PRN
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
## 2013-10-30 NOTE — Discharge Instructions (Signed)
You have a strain of the muscles in your lower back. Take meloxicam 1 pill daily for 1 week, then as needed for pain. Take Flexeril 1 pill up to 3 times a day as needed for pain/muscle spasm.  This medicine will make you tired and loopy.  Do not drive while taking it. Alternate heat and ice to the area, 20 minutes each.  Do this at least once a day. Stay as active as you can.  Follow up if your symptoms are worsening or you are not improving after 1-2 weeks.
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
## 2014-01-31 ENCOUNTER — Encounter (HOSPITAL_COMMUNITY): Payer: Self-pay | Admitting: Emergency Medicine

## 2014-04-06 ENCOUNTER — Emergency Department (HOSPITAL_COMMUNITY)
Admission: EM | Admit: 2014-04-06 | Discharge: 2014-04-06 | Disposition: A | Discharge status: Home / Self Care | Payer: Self-pay | Attending: Emergency Medicine | Admitting: Emergency Medicine

## 2014-04-06 ENCOUNTER — Encounter (HOSPITAL_COMMUNITY): Payer: Self-pay | Admitting: *Deleted

## 2014-04-06 DIAGNOSIS — Z79899 Other long term (current) drug therapy: Secondary | ICD-10-CM | POA: Insufficient documentation

## 2014-04-06 DIAGNOSIS — A599 Trichomoniasis, unspecified: Secondary | ICD-10-CM | POA: Insufficient documentation

## 2014-04-06 DIAGNOSIS — Z87891 Personal history of nicotine dependence: Secondary | ICD-10-CM | POA: Insufficient documentation

## 2014-04-06 DIAGNOSIS — Z791 Long term (current) use of non-steroidal anti-inflammatories (NSAID): Secondary | ICD-10-CM | POA: Insufficient documentation

## 2014-04-06 DIAGNOSIS — R197 Diarrhea, unspecified: Secondary | ICD-10-CM | POA: Insufficient documentation

## 2014-04-06 DIAGNOSIS — Z862 Personal history of diseases of the blood and blood-forming organs and certain disorders involving the immune mechanism: Secondary | ICD-10-CM | POA: Insufficient documentation

## 2014-04-06 DIAGNOSIS — Z113 Encounter for screening for infections with a predominantly sexual mode of transmission: Secondary | ICD-10-CM | POA: Insufficient documentation

## 2014-04-06 LAB — WET PREP, GENITAL
Clue Cells Wet Prep HPF POC: NONE SEEN
WBC, Wet Prep HPF POC: NONE SEEN
Yeast Wet Prep HPF POC: NONE SEEN

## 2014-04-06 LAB — URINALYSIS, ROUTINE W REFLEX MICROSCOPIC
Bilirubin Urine: NEGATIVE
Glucose, UA: NEGATIVE mg/dL
Ketones, ur: NEGATIVE mg/dL
Nitrite: NEGATIVE
PROTEIN: NEGATIVE mg/dL
Specific Gravity, Urine: 1.017 (ref 1.005–1.030)
Urobilinogen, UA: 1 mg/dL (ref 0.0–1.0)
pH: 6 (ref 5.0–8.0)

## 2014-04-06 LAB — CBC WITH DIFFERENTIAL/PLATELET
Basophils Absolute: 0 10*3/uL (ref 0.0–0.1)
Basophils Relative: 0 % (ref 0–1)
EOS ABS: 0 10*3/uL (ref 0.0–0.7)
Eosinophils Relative: 0 % (ref 0–5)
HEMATOCRIT: 44.1 % (ref 36.0–46.0)
Hemoglobin: 13.9 g/dL (ref 12.0–15.0)
LYMPHS ABS: 0.8 10*3/uL (ref 0.7–4.0)
LYMPHS PCT: 11 % — AB (ref 12–46)
MCH: 27.7 pg (ref 26.0–34.0)
MCHC: 31.5 g/dL (ref 30.0–36.0)
MCV: 87.8 fL (ref 78.0–100.0)
MONOS PCT: 6 % (ref 3–12)
Monocytes Absolute: 0.5 10*3/uL (ref 0.1–1.0)
Neutro Abs: 6.1 10*3/uL (ref 1.7–7.7)
Neutrophils Relative %: 83 % — ABNORMAL HIGH (ref 43–77)
Platelets: 230 10*3/uL (ref 150–400)
RBC: 5.02 MIL/uL (ref 3.87–5.11)
RDW: 13.9 % (ref 11.5–15.5)
WBC: 7.4 10*3/uL (ref 4.0–10.5)

## 2014-04-06 LAB — BASIC METABOLIC PANEL
ANION GAP: 8 (ref 5–15)
BUN: 6 mg/dL (ref 6–23)
CHLORIDE: 107 meq/L (ref 96–112)
CO2: 24 mmol/L (ref 19–32)
CREATININE: 0.61 mg/dL (ref 0.50–1.10)
Calcium: 9.2 mg/dL (ref 8.4–10.5)
GLUCOSE: 93 mg/dL (ref 70–99)
POTASSIUM: 3.6 mmol/L (ref 3.5–5.1)
Sodium: 139 mmol/L (ref 135–145)

## 2014-04-06 LAB — URINE MICROSCOPIC-ADD ON

## 2014-04-06 LAB — RPR

## 2014-04-06 LAB — POC URINE PREG, ED: PREG TEST UR: NEGATIVE

## 2014-04-06 MED ORDER — CEFTRIAXONE SODIUM 250 MG IJ SOLR
250.0000 mg | Freq: Once | INTRAMUSCULAR | Status: AC
  Administered 2014-04-06: 250 mg via INTRAMUSCULAR
  Filled 2014-04-06: qty 250

## 2014-04-06 MED ORDER — METRONIDAZOLE 500 MG PO TABS
2000.0000 mg | ORAL_TABLET | Freq: Once | ORAL | Status: AC
  Administered 2014-04-06: 2000 mg via ORAL
  Filled 2014-04-06: qty 4

## 2014-04-06 MED ORDER — STERILE WATER FOR INJECTION IJ SOLN
INTRAMUSCULAR | Status: AC
Start: 2014-04-06 — End: 2014-04-06
  Administered 2014-04-06: 10 mL
  Filled 2014-04-06: qty 10

## 2014-04-06 MED ORDER — AZITHROMYCIN 250 MG PO TABS
1000.0000 mg | ORAL_TABLET | Freq: Once | ORAL | Status: AC
  Administered 2014-04-06: 1000 mg via ORAL
  Filled 2014-04-06: qty 4

## 2014-04-06 NOTE — ED Notes (Signed)
Per EMS pt c/p diarrhea x 2-3 days after her son had it, denies n/v, fever or abd. Pain.

## 2014-04-06 NOTE — ED Provider Notes (Signed)
CSN: [id number]     Arrival date & time 04/06/14  0912 History   First MD Initiated Contact with Patient 04/06/14 0915     Chief Complaint  Patient presents with  . Diarrhea     (Consider location/radiation/quality/duration/timing/severity/associated sxs/prior Treatment) HPI Comments: Patient with PMH of chlamydia and anemia, who presents to the emergency department with chief complaint of diarrhea. Patient states that she has had 3 episodes of watery diarrhea this morning. She states that 2-3 days ago her son had similar symptoms. She denies any fevers, chills, nausea, vomiting, or abdominal pain. She has not taken anything to alleviate her symptoms. Additionally, patient requests STD screen for having unprotected sex with a new partner. She states that she has had a mild amount of vaginal discharge and some spotting.  The history is provided by the patient. No language interpreter was used.    Past Medical History  Diagnosis Date  . Anemia   . Chlamydia 2012  . Anemia    Past Surgical History  Procedure Laterality Date  . Cesarean section    . Tooth extraction  2010   Family History  Problem Relation Age of Onset  . Cancer Maternal Aunt   . Anesthesia problems Neg Hx   . Hypotension Neg Hx   . Malignant hyperthermia Neg Hx   . Pseudochol deficiency Neg Hx   . Other Neg Hx    History  Substance Use Topics  . Smoking status: Former Smoker -- 1.00 packs/day    Types: Cigarettes    Quit date: 07/12/2011  . Smokeless tobacco: Never Used  . Alcohol Use: No   OB History    Gravida Para Term Preterm AB TAB SAB Ectopic Multiple Living   Review of Systems  Constitutional: Negative for fever and chills.  Respiratory: Negative for shortness of breath.   Cardiovascular: Negative for chest pain.  Gastrointestinal: Positive for diarrhea. Negative for nausea, vomiting and constipation.  Genitourinary: Positive for vaginal bleeding and vaginal discharge.  Negative for dysuria.  All other systems reviewed and are negative.     Allergies  Review of patient's allergies indicates no known allergies.  Home Medications   Prior to Admission medications   Medication Sig Start Date End Date Taking? Authorizing Provider  cyclobenzaprine (FLEXERIL) 5 MG tablet Take 1 tablet (5 mg total) by mouth 3 (three) times daily as needed for muscle spasms. 10/30/13   Charm Rings, MD  ibuprofen (ADVIL,MOTRIN) 800 MG tablet Take 1 tablet (800 mg total) by mouth every 8 (eight) hours as needed for pain. 02/22/12   Heather Alger Memos, CNM  medroxyPROGESTERone (PROVERA) 10 MG tablet Take 1 tablet (10 mg total) by mouth daily. 09/29/13   Reuben Likes, MD  meloxicam (MOBIC) 15 MG tablet Take 1 tablet (15 mg total) by mouth daily. For 1 week, then prn 10/30/13   Charm Rings, MD  metroNIDAZOLE (FLAGYL) 500 MG tablet Take 4 tablets all at 1 time. 10/01/13   Reuben Likes, MD  naproxen (NAPROSYN) 500 MG tablet Take 1 tablet (500 mg total) by mouth 2 (two) times daily. 02/18/13   Adrian Blackwater Baker, PA-C   BP 103/68 mmHg  Pulse 87  Temp(Src) 98.1 F (36.7 C) (Oral)  Resp 20  SpO2 100% Physical Exam  Constitutional: She is oriented to person, place, and time. She appears well-developed and well-nourished.  HENT:  Head: Normocephalic and atraumatic.  Eyes: Conjunctivae and EOM are normal. Pupils are equal, round, and reactive to light.  Neck: Normal range of motion. Neck supple.  Cardiovascular: Normal rate and regular rhythm.  Exam reveals no gallop and no friction rub.   No murmur heard. Pulmonary/Chest: Effort normal and breath sounds normal. No respiratory distress. She has no wheezes. She has no rales. She exhibits no tenderness.  Abdominal: Soft. She exhibits no distension and no mass. There is no tenderness. There is no rebound and no guarding.  No focal abdominal tenderness, no RLQ tenderness or pain at McBurney's point, no RUQ tenderness or Murphy's sign, no  left-sided abdominal tenderness, no fluid wave, or signs of peritonitis   Genitourinary:  Pelvic exam chaperoned by female ER tech, no right or left adnexal tenderness, no uterine tenderness, no vaginal discharge, scant amount of blood, no active hemorrhage, no CMT or friability, no foreign body, no injury to the external genitalia, no other significant findings   Musculoskeletal: Normal range of motion. She exhibits no edema or tenderness.  Neurological: She is alert and oriented to person, place, and time.  Skin: Skin is warm and dry.  Psychiatric: She has a normal mood and affect. Her behavior is normal. Judgment and thought content normal.  Nursing note and vitals reviewed.   ED Course  Procedures (including critical care time) Results for orders placed or performed during the hospital encounter of 04/06/14  Wet prep, genital  Result Value Ref Range   Yeast Wet Prep HPF POC NONE SEEN NONE SEEN   Trich, Wet Prep MODERATE (A) NONE SEEN   Clue Cells Wet Prep HPF POC NONE SEEN NONE SEEN   WBC, Wet Prep HPF POC NONE SEEN NONE SEEN  CBC with Differential  Result Value Ref Range   WBC 7.4 4.0 - 10.5 K/uL   RBC 5.02 3.87 - 5.11 MIL/uL   Hemoglobin 13.9 12.0 - 15.0 g/dL   HCT 40.9 81.1 - 91.4 %   MCV 87.8 78.0 - 100.0 fL   MCH 27.7 26.0 - 34.0 pg   MCHC 31.5 30.0 - 36.0 g/dL   RDW 78.2 95.6 - 21.3 %   Platelets 230 150 - 400 K/uL   Neutrophils Relative % 83 (H) 43 - 77 %   Neutro Abs 6.1 1.7 - 7.7 K/uL   Lymphocytes Relative 11 (L) 12 - 46 %   Lymphs Abs 0.8 0.7 - 4.0 K/uL   Monocytes Relative 6 3 - 12 %   Monocytes Absolute 0.5 0.1 - 1.0 K/uL   Eosinophils Relative 0 0 - 5 %   Eosinophils Absolute 0.0 0.0 - 0.7 K/uL   Basophils Relative 0 0 - 1 %   Basophils Absolute 0.0 0.0 - 0.1 K/uL  Basic metabolic panel  Result Value Ref Range   Sodium 139 135 - 145 mmol/L   Potassium 3.6 3.5 - 5.1 mmol/L   Chloride 107 96 - 112 mEq/L   CO2 24 19 - 32 mmol/L   Glucose, Bld 93 70 - 99  mg/dL   BUN 6 6 - 23 mg/dL   Creatinine, Ser 0.86 0.50 - 1.10 mg/dL   Calcium 9.2 8.4 - 57.8 mg/dL   GFR calc non Af Amer >90 >90 mL/min   GFR calc Af Amer >90 >90 mL/min   Anion gap 8 5 - 15  Urinalysis, Routine w reflex microscopic  Result Value Ref Range   Color, Urine YELLOW YELLOW   APPearance CLOUDY (A) CLEAR   Specific Gravity, Urine 1.017  1.005 - 1.030   pH 6.0 5.0 - 8.0   Glucose, UA NEGATIVE NEGATIVE mg/dL   Hgb urine dipstick MODERATE (A) NEGATIVE   Bilirubin Urine NEGATIVE NEGATIVE   Ketones, ur NEGATIVE NEGATIVE mg/dL   Protein, ur NEGATIVE NEGATIVE mg/dL   Urobilinogen, UA 1.0 0.0 - 1.0 mg/dL   Nitrite NEGATIVE NEGATIVE   Leukocytes, UA MODERATE (A) NEGATIVE  Urine microscopic-add on  Result Value Ref Range   WBC, UA 3-6 <3 WBC/hpf   RBC / HPF 7-10 <3 RBC/hpf   Urine-Other MUCOUS PRESENT   [facility]  Result Value Ref Range   Preg Test, Ur NEGATIVE NEGATIVE   No results found.   Imaging Review No results found.   EKG Interpretation None      MDM   Final diagnoses:  Diarrhea  Screen for STD (sexually transmitted disease)  Trichomonas infection    Pelvic largely unremarkable.  Patient now states that she is on her period.  Scant amount of blood noted in vaginal vault.  Wet prep and urinalysis remarkable for Trichomonas, otherwise labs are unremarkable. Will cover with Rocephin, azithromycin, and Flagyl.  Patient feeling well.  Discharged home.    Roxy Horsemanobert Catalyna Reilly, PA-C 04/06/14 1043  Raeford RazorStephen Kohut, MD 04/06/14 [phone]

## 2014-04-06 NOTE — Discharge Instructions (Signed)
Trichomoniasis °Trichomoniasis is an infection caused by an organism called Trichomonas. The infection can affect both women and men. In women, the outer female genitalia and the vagina are affected. In men, the penis is mainly affected, but the prostate and other reproductive organs can also be involved. Trichomoniasis is a sexually transmitted infection (STI) and is most often passed to another person through sexual contact.  °RISK FACTORS °· Having unprotected sexual intercourse. °· Having sexual intercourse with an infected partner. °SIGNS AND SYMPTOMS  °Symptoms of trichomoniasis in women include: °· Abnormal gray-green frothy vaginal discharge. °· Itching and irritation of the vagina. °· Itching and irritation of the area outside the vagina. °Symptoms of trichomoniasis in men include:  °· Penile discharge with or without pain. °· Pain during urination. This results from inflammation of the urethra. °DIAGNOSIS  °Trichomoniasis may be found during a Pap test or physical exam. Your health care provider may use one of the following methods to help diagnose this infection: °· Examining vaginal discharge under a microscope. For men, urethral discharge would be examined. °· Testing the pH of the vagina with a test tape. °· Using a vaginal swab test that checks for the Trichomonas organism. A test is available that provides results within a few minutes. °· Doing a culture test for the organism. This is not usually needed. °TREATMENT  °· You may be given medicine to fight the infection. Women should inform their health care provider if they could be or are pregnant. Some medicines used to treat the infection should not be taken during pregnancy. °· Your health care provider may recommend over-the-counter medicines or creams to decrease itching or irritation. °· Your sexual partner will need to be treated if infected. °HOME CARE INSTRUCTIONS  °· Take medicines only as directed by your health care provider. °· Take  over-the-counter medicine for itching or irritation as directed by your health care provider. °· Do not have sexual intercourse while you have the infection. °· Women should not douche or wear tampons while they have the infection. °· Discuss your infection with your partner. Your partner may have gotten the infection from you, or you may have gotten it from your partner. °· Have your sex partner get examined and treated if necessary. °· Practice safe, informed, and protected sex. °· See your health care provider for other STI testing. °SEEK MEDICAL CARE IF:  °· You still have symptoms after you finish your medicine. °· You develop abdominal pain. °· You have pain when you urinate. °· You have bleeding after sexual intercourse. °· You develop a rash. °· Your medicine makes you sick or makes you throw up (vomit). °MAKE SURE YOU: °· Understand these instructions. °· Will watch your condition. °· Will get help right away if you are not doing well or get worse. °Document Released: 09/11/2000 Document Revised: 08/02/2013 Document Reviewed: 12/28/2012 °[employer]® Patient Information ©2015 [employer]. This information is not intended to replace advice given to you by your health care provider. Make sure you discuss any questions you have with your health care provider. ° °Sexually Transmitted Disease °A sexually transmitted disease (STD) is a disease or infection that may be passed (transmitted) from person to person, usually during sexual activity. This may happen by way of saliva, semen, blood, vaginal mucus, or urine. Common STDs include:  °· Gonorrhea.   °· Chlamydia.   °· Syphilis.   °· HIV and AIDS.   °· Genital herpes.   °· Hepatitis B and C.   °· Trichomonas.   °· Human papillomavirus (  HPV).   °· Pubic lice.   °· Scabies. °· Mites. °· Bacterial vaginosis. °WHAT ARE CAUSES OF STDs? °An STD may be caused by bacteria, a virus, or parasites. STDs are often transmitted during sexual activity if one person is infected.  However, they may also be transmitted through nonsexual means. STDs may be transmitted after:  °· Sexual intercourse with an infected person.   °· Sharing sex toys with an infected person.   °· Sharing needles with an infected person or using unclean piercing or tattoo needles. °· Having intimate contact with the genitals, mouth, or rectal areas of an infected person.   °· Exposure to infected fluids during birth. °WHAT ARE THE SIGNS AND SYMPTOMS OF STDs? °Different STDs have different symptoms. Some people may not have any symptoms. If symptoms are present, they may include:  °· Painful or bloody urination.   °· Pain in the pelvis, abdomen, vagina, anus, throat, or eyes.   °· A skin rash, itching, or irritation. °· Growths, ulcerations, blisters, or sores in the genital and anal areas. °· Abnormal vaginal discharge with or without bad odor.   °· Penile discharge in men.   °· Fever.   °· Pain or bleeding during sexual intercourse.   °· Swollen glands in the groin area.   °· Yellow skin and eyes (jaundice). This is seen with hepatitis.   °· Swollen testicles. °· Infertility. °· Sores and blisters in the mouth. °HOW ARE STDs DIAGNOSED? °To make a diagnosis, your health care provider may:  °· Take a medical history.   °· Perform a physical exam.   °· Take a sample of any discharge to examine. °· Swab the throat, cervix, opening to the penis, rectum, or vagina for testing. °· Test a sample of your first morning urine.   °· Perform blood tests.   °· Perform a Pap test, if this applies.   °· Perform a colposcopy.   °· Perform a laparoscopy.   °HOW ARE STDs TREATED? ° Treatment depends on the STD. Some STDs may be treated but not cured.  °· Chlamydia, gonorrhea, trichomonas, and syphilis can be cured with antibiotic medicine.   °· Genital herpes, hepatitis, and HIV can be treated, but not cured, with prescribed medicines. The medicines lessen symptoms.   °· Genital warts from HPV can be treated with medicine or by  freezing, burning (electrocautery), or surgery. Warts may come back.   °· HPV cannot be cured with medicine or surgery. However, abnormal areas may be removed from the cervix, vagina, or vulva.   °· If your diagnosis is confirmed, your recent sexual partners need treatment. This is true even if they are symptom-free or have a negative culture or evaluation. They should not have sex until their health care providers say it is okay. °HOW CAN I REDUCE MY RISK OF GETTING AN STD? °Take these steps to reduce your risk of getting an STD: °· Use latex condoms, dental dams, and water-soluble lubricants during sexual activity. Do not use petroleum jelly or oils. °· Avoid having multiple sex partners. °· Do not have sex with someone who has other sex partners. °· Do not have sex with anyone you do not know or who is at high risk for an STD. °· Avoid risky sex practices that can break your skin. °· Do not have sex if you have open sores on your mouth or skin. °· Avoid drinking too much alcohol or taking illegal drugs. Alcohol and drugs can affect your judgment and put you in a vulnerable position. °· Avoid engaging in oral and anal sex acts. °· Get vaccinated for HPV and hepatitis. If you   have not received these vaccines in the past, talk to your health care provider about whether one or both might be right for you.   If you are at risk of being infected with HIV, it is recommended that you take a prescription medicine daily to prevent HIV infection. This is called pre-exposure prophylaxis (PrEP). You are considered at risk if:  You are a man who has sex with other men (MSM).  You are a heterosexual man or woman and are sexually active with more than one partner.  You take drugs by injection.  You are sexually active with a partner who has HIV.  Talk with your health care provider about whether you are at high risk of being infected with HIV. If you choose to begin PrEP, you should first be tested for HIV. You  should then be tested every 3 months for as long as you are taking PrEP.  WHAT SHOULD I DO IF I THINK I HAVE AN STD?  See your health care provider.   Tell your sexual partner(s). They should be tested and treated for any STDs.  Do not have sex until your health care provider says it is okay. WHEN SHOULD I GET IMMEDIATE MEDICAL CARE? Contact your health care provider right away if:   You have severe abdominal pain.  You are a man and notice swelling or pain in your testicles.  You are a woman and notice swelling or pain in your vagina. Document Released: 06/08/2002 Document Revised: 03/23/2013 Document Reviewed: 10/06/2012 [facility] Patient Information 2015 [employer] [place]. This information is not intended to replace advice given to you by your health care provider. Make sure you discuss any questions you have with your health care provider. Diarrhea Diarrhea is frequent loose and watery bowel movements. It can cause you to feel weak and dehydrated. Dehydration can cause you to become tired and thirsty, have a dry mouth, and have decreased urination that often is dark yellow. Diarrhea is a sign of another problem, most often an infection that will not last long. In most cases, diarrhea typically lasts 2-3 days. However, it can last longer if it is a sign of something more serious. It is important to treat your diarrhea as directed by your caregiver to lessen or prevent future episodes of diarrhea. CAUSES  Some common causes include:  Gastrointestinal infections caused by viruses, bacteria, or parasites.  Food poisoning or food allergies.  Certain medicines, such as antibiotics, chemotherapy, and laxatives.  Artificial sweeteners and fructose.  Digestive disorders. HOME CARE INSTRUCTIONS  Ensure adequate fluid intake (hydration): Have 1 cup (8 oz) of fluid for each diarrhea episode. Avoid fluids that contain simple sugars or sports drinks, fruit juices, whole milk products, and  sodas. Your urine should be clear or pale yellow if you are drinking enough fluids. Hydrate with an oral rehydration solution that you can purchase at pharmacies, retail stores, and online. You can prepare an oral rehydration solution at home by mixing the following ingredients together:   - tsp table salt.   tsp baking soda.   tsp salt substitute containing potassium chloride.  1  tablespoons sugar.  1 L (34 oz) of water.  Certain foods and beverages may increase the speed at which food moves through the gastrointestinal (GI) tract. These foods and beverages should be avoided and include:  Caffeinated and alcoholic beverages.  High-fiber foods, such as raw fruits and vegetables, nuts, seeds, and whole grain breads and cereals.  Foods and beverages sweetened with  sugar alcohols, such as xylitol, sorbitol, and mannitol.  Some foods may be well tolerated and may help thicken stool including:  Starchy foods, such as rice, toast, pasta, low-sugar cereal, oatmeal, grits, baked potatoes, crackers, and bagels.  Bananas.  Applesauce.  Add probiotic-rich foods to help increase healthy bacteria in the GI tract, such as yogurt and fermented milk products.  Wash your hands well after each diarrhea episode.  Only take over-the-counter or prescription medicines as directed by your caregiver.  Take a warm bath to relieve any burning or pain from frequent diarrhea episodes. SEEK IMMEDIATE MEDICAL CARE IF:   You are unable to keep fluids down.  You have persistent vomiting.  You have blood in your stool, or your stools are black and tarry.  You do not urinate in 6-8 hours, or there is only a small amount of very dark urine.  You have abdominal pain that increases or localizes.  You have weakness, dizziness, confusion, or light-headedness.  You have a severe headache.  Your diarrhea gets worse or does not get better.  You have a fever or persistent symptoms for more than 2-3  days.  You have a fever and your symptoms suddenly get worse. MAKE SURE YOU:   Understand these instructions.  Will watch your condition.  Will get help right away if you are not doing well or get worse. Document Released: 03/08/2002 Document Revised: 08/02/2013 Document Reviewed: 11/24/2011 [facility] Patient Information 2015 [place], [place]. This information is not intended to replace advice given to you by your health care provider. Make sure you discuss any questions you have with your health care provider.

## 2014-04-07 LAB — GC/CHLAMYDIA PROBE AMP
CT Probe RNA: NEGATIVE
GC Probe RNA: NEGATIVE

## 2014-04-07 LAB — HIV ANTIBODY (ROUTINE TESTING W REFLEX): HIV-1/HIV-2 Ab: NONREACTIVE

## 2014-04-15 ENCOUNTER — Telehealth (HOSPITAL_COMMUNITY): Payer: Self-pay

## 2014-11-23 ENCOUNTER — Emergency Department (HOSPITAL_COMMUNITY)
Admission: EM | Admit: 2014-11-23 | Discharge: 2014-11-23 | Disposition: A | Discharge status: Home / Self Care | Payer: Self-pay | Attending: Emergency Medicine | Admitting: Emergency Medicine

## 2014-11-23 ENCOUNTER — Encounter (HOSPITAL_COMMUNITY): Payer: Self-pay | Admitting: *Deleted

## 2014-11-23 DIAGNOSIS — Z862 Personal history of diseases of the blood and blood-forming organs and certain disorders involving the immune mechanism: Secondary | ICD-10-CM | POA: Insufficient documentation

## 2014-11-23 DIAGNOSIS — N898 Other specified noninflammatory disorders of vagina: Secondary | ICD-10-CM | POA: Insufficient documentation

## 2014-11-23 DIAGNOSIS — Z87891 Personal history of nicotine dependence: Secondary | ICD-10-CM | POA: Insufficient documentation

## 2014-11-23 DIAGNOSIS — Z8619 Personal history of other infectious and parasitic diseases: Secondary | ICD-10-CM | POA: Insufficient documentation

## 2014-11-23 DIAGNOSIS — M545 Low back pain: Secondary | ICD-10-CM | POA: Insufficient documentation

## 2014-11-23 DIAGNOSIS — Z3202 Encounter for pregnancy test, result negative: Secondary | ICD-10-CM | POA: Insufficient documentation

## 2014-11-23 DIAGNOSIS — R109 Unspecified abdominal pain: Secondary | ICD-10-CM | POA: Insufficient documentation

## 2014-11-23 LAB — URINALYSIS, ROUTINE W REFLEX MICROSCOPIC
BILIRUBIN URINE: NEGATIVE
Glucose, UA: NEGATIVE mg/dL
Hgb urine dipstick: NEGATIVE
Ketones, ur: NEGATIVE mg/dL
Leukocytes, UA: NEGATIVE
NITRITE: NEGATIVE
PROTEIN: NEGATIVE mg/dL
SPECIFIC GRAVITY, URINE: 1.024 (ref 1.005–1.030)
UROBILINOGEN UA: 0.2 mg/dL (ref 0.0–1.0)
pH: 7.5 (ref 5.0–8.0)

## 2014-11-23 LAB — WET PREP, GENITAL
Trich, Wet Prep: NONE SEEN
Yeast Wet Prep HPF POC: NONE SEEN

## 2014-11-23 LAB — POC URINE PREG, ED: PREG TEST UR: NEGATIVE

## 2014-11-23 MED ORDER — AZITHROMYCIN 250 MG PO TABS
1000.0000 mg | ORAL_TABLET | Freq: Once | ORAL | Status: AC
  Administered 2014-11-23: 1000 mg via ORAL
  Filled 2014-11-23: qty 4

## 2014-11-23 MED ORDER — METRONIDAZOLE 500 MG PO TABS: 500.0000 mg | ORAL_TABLET | Freq: Two times a day (BID) | ORAL | Status: DC

## 2014-11-23 MED ORDER — CEFTRIAXONE SODIUM 250 MG IJ SOLR
250.0000 mg | Freq: Once | INTRAMUSCULAR | Status: AC
  Administered 2014-11-23: 250 mg via INTRAMUSCULAR
  Filled 2014-11-23: qty 250

## 2014-11-23 MED ORDER — LIDOCAINE HCL 1 % IJ SOLN
INTRAMUSCULAR | Status: AC
Start: 2014-11-23 — End: 2014-11-23
  Administered 2014-11-23: 20 mL
  Filled 2014-11-23: qty 20

## 2014-11-23 NOTE — ED Notes (Signed)
Patient states no N/V or Diarrhea.

## 2014-11-23 NOTE — ED Notes (Signed)
Pt complains of abdominal pain, back pain and malodorous vaginal discharge for the past week. Pt states she is concerned she has an STD. Pt states she has had urinary frequency for the past week, denies dysuria

## 2014-11-23 NOTE — ED Provider Notes (Signed)
CSN: [id number]     Arrival date & time 11/23/14  1225 History   First MD Initiated Contact with Patient 11/23/14 1453     Chief Complaint  Patient presents with  . Abdominal Pain  . Back Pain  . Vaginal Discharge     (Consider location/radiation/quality/duration/timing/severity/associated sxs/prior Treatment) Patient is a 24 y.o. female presenting with abdominal pain, back pain, and vaginal discharge.  Abdominal Pain Associated symptoms: dysuria and vaginal discharge   Back Pain Associated symptoms: abdominal pain and dysuria   Vaginal Discharge Associated symptoms: abdominal pain and dysuria     Complains of low abdominal pain radiating to low back onset one week ago pain is intermittent lasting 2 minutes at a time she's had 3 episodes today associated symptoms include vaginal discharge and slight burning at urethral meatus, worse with urination. She denies fever denies nausea or vomiting no anorexia. Last menstrual period 2 weeks ago. Menses is irregular symptoms on Implanon. No treatment prior to coming here. No other associated symptoms. Patient is concerned she might have an STD. Patient asymptomatic presently Past Medical History  Diagnosis Date  . Anemia   . Chlamydia 2012  . Anemia    Past Surgical History  Procedure Laterality Date  . Cesarean section    . Tooth extraction  2010   Family History  Problem Relation Age of Onset  . Cancer Maternal Aunt   . Anesthesia problems Neg Hx   . Hypotension Neg Hx   . Malignant hyperthermia Neg Hx   . Pseudochol deficiency Neg Hx   . Other Neg Hx    Social History  Substance Use Topics  . Smoking status: Former Smoker -- 1.00 packs/day    Types: Cigarettes    Quit date: 07/12/2011  . Smokeless tobacco: Never Used  . Alcohol Use: No   OB History    Gravida Para Term Preterm AB TAB SAB Ectopic Multiple Living   Review of Systems  Constitutional: Negative.   HENT: Negative.   Respiratory:  Negative.   Cardiovascular: Negative.   Gastrointestinal: Positive for abdominal pain.  Genitourinary: Positive for dysuria and vaginal discharge.  Musculoskeletal: Positive for back pain.  Skin: Negative.   Neurological: Negative.   Psychiatric/Behavioral: Negative.   All other systems reviewed and are negative.     Allergies  Review of patient's allergies indicates no known allergies.  Home Medications   Prior to Admission medications   Medication Sig Start Date End Date Taking? Authorizing Provider  etonogestrel (IMPLANON) 68 MG IMPL implant 1 each by Subdermal route once. 05/2012   Yes Historical Provider, MD  cyclobenzaprine (FLEXERIL) 5 MG tablet Take 1 tablet (5 mg total) by mouth 3 (three) times daily as needed for muscle spasms. Patient not taking: Reported on 04/06/2014 10/30/13   Charm Rings, MD  medroxyPROGESTERone (PROVERA) 10 MG tablet Take 1 tablet (10 mg total) by mouth daily. Patient not taking: Reported on 04/06/2014 09/29/13   Reuben Likes, MD   BP 115/80 mmHg  Pulse 112  Temp(Src) 97.9 F (36.6 C) (Oral)  Resp 18  Ht  (1.702 m)  Wt 110 lb (49.896 kg)  BMI 17.22 kg/m2  SpO2 100%  LMP 11/09/2014 Physical Exam  Constitutional: She appears well-developed and well-nourished.  HENT:  Head: Normocephalic and atraumatic.  Eyes: Conjunctivae are normal. Pupils are equal, round, and reactive to light.  Neck: Neck supple. No tracheal deviation present.  No thyromegaly present.  Cardiovascular: Normal rate and regular rhythm.   No murmur heard. Pulmonary/Chest: Effort normal and breath sounds normal.  Abdominal: Soft. Bowel sounds are normal. She exhibits no distension. There is no tenderness.  Genitourinary:  No external lesion. White vaginal discharge. Cervical os closed. No cervical motion tenderness no masses adnexal masses or tenderness  Musculoskeletal: Normal range of motion. She exhibits no edema or tenderness.  Neurological: She is alert. Coordination  normal.  Skin: Skin is warm and dry. No rash noted.  Psychiatric: She has a normal mood and affect.  Nursing note and vitals reviewed.   ED Course  Procedures (including critical care time) Labs Review Labs Reviewed - No data to display  Imaging Review No results found. I have personally reviewed and evaluated these images and lab results as part of my medical decision-making.   EKG Interpretation None     5:05 PM patient resting comfortably. Results for orders placed or performed during the hospital encounter of 11/23/14  Wet prep, genital  Result Value Ref Range   Yeast Wet Prep HPF POC NONE SEEN NONE SEEN   Trich, Wet Prep NONE SEEN NONE SEEN   Clue Cells Wet Prep HPF POC FEW (A) NONE SEEN   WBC, Wet Prep HPF POC MODERATE (A) NONE SEEN  Urinalysis, Routine w reflex microscopic (not at [facility])  Result Value Ref Range   Color, Urine YELLOW YELLOW   APPearance CLOUDY (A) CLEAR   Specific Gravity, Urine 1.024 1.005 - 1.030   pH 7.5 5.0 - 8.0   Glucose, UA NEGATIVE NEGATIVE mg/dL   Hgb urine dipstick NEGATIVE NEGATIVE   Bilirubin Urine NEGATIVE NEGATIVE   Ketones, ur NEGATIVE NEGATIVE mg/dL   Protein, ur NEGATIVE NEGATIVE mg/dL   Urobilinogen, UA 0.2 0.0 - 1.0 mg/dL   Nitrite NEGATIVE NEGATIVE   Leukocytes, UA NEGATIVE NEGATIVE  POC urine preg, ED (not at [facility])  Result Value Ref Range   Preg Test, Ur NEGATIVE NEGATIVE   No results found.  MDM  Plan we'll treat empirically for sexually transmitted diseases with a vaginal discharge clinical suspicion Final diagnoses:  None   Rocephin, Zithromax, safe sex encouraged, prescription Flagyl referral [facility] Dx#1 vaginal discharge #2dysuria    Doug Sou, MD 11/23/14 1712

## 2014-11-23 NOTE — Discharge Instructions (Signed)
Safe Sex Take the medication as prescribed. Call the women's hospital clinic if not feeling better in a week. Safe sex is about reducing the risk of giving or getting a sexually transmitted disease (STD). STDs are spread through sexual contact involving the genitals, mouth, or rectum. Some STDs can be cured and others cannot. Safe sex can also prevent unintended pregnancies.  WHAT ARE SOME SAFE SEX PRACTICES?  Limit your sexual activity to only one partner who is having sex with only you.  Talk to your partner about his or her past partners, past STDs, and drug use.  Use a condom every time you have sexual intercourse. This includes vaginal, oral, and anal sexual activity. Both females and males should wear condoms during oral sex. Only use latex or polyurethane condoms and water-based lubricants. Using petroleum-based lubricants or oils to lubricate a condom will weaken the condom and increase the chance that it will break. The condom should be in place from the beginning to the end of sexual activity. Wearing a condom reduces, but does not completely eliminate, your risk of getting or giving an STD. STDs can be spread by contact with infected body fluids and skin.  Get vaccinated for hepatitis B and HPV.  Avoid alcohol and recreational drugs, which can affect your judgment. You may forget to use a condom or participate in high-risk sex.  For females, avoid douching after sexual intercourse. Douching can spread an infection farther into the reproductive tract.  Check your body for signs of sores, blisters, rashes, or unusual discharge. See your health care provider if you notice any of these signs.  Avoid sexual contact if you have symptoms of an infection or are being treated for an STD. If you or your partner has herpes, avoid sexual contact when blisters are present. Use condoms at all other times.  If you are at risk of being infected with HIV, it is recommended that you take a prescription  medicine daily to prevent HIV infection. This is called pre-exposure prophylaxis (PrEP). You are considered at risk if:  You are a man who has sex with other men (MSM).  You are a heterosexual man or woman who is sexually active with more than one partner.  You take drugs by injection.  You are sexually active with a partner who has HIV.  Talk with your health care provider about whether you are at high risk of being infected with HIV. If you choose to begin PrEP, you should first be tested for HIV. You should then be tested every 3 months for as long as you are taking PrEP.  See your health care provider for regular screenings, exams, and tests for other STDs. Before having sex with a new partner, each of you should be screened for STDs and should talk about the results with each other. WHAT ARE THE BENEFITS OF SAFE SEX?   There is less chance of getting or giving an STD.  You can prevent unwanted or unintended pregnancies.  By discussing safe sex concerns with your partner, you may increase feelings of intimacy, comfort, trust, and honesty between the two of you. Document Released: 04/25/2004 Document Revised: 08/02/2013 Document Reviewed: 09/09/2011 [facility] Patient Information 2015 [place], [place]. This information is not intended to replace advice given to you by your health care provider. Make sure you discuss any questions you have with your health care provider.

## 2014-11-24 LAB — GC/CHLAMYDIA PROBE AMP (~~LOC~~) NOT AT ARMC
CHLAMYDIA, DNA PROBE: NEGATIVE
NEISSERIA GONORRHEA: NEGATIVE

## 2014-11-24 LAB — HIV ANTIBODY (ROUTINE TESTING W REFLEX): HIV SCREEN 4TH GENERATION: NONREACTIVE

## 2014-11-24 LAB — RPR: RPR Ser Ql: NONREACTIVE

## 2015-05-23 ENCOUNTER — Emergency Department (HOSPITAL_COMMUNITY)
Admission: EM | Admit: 2015-05-23 | Discharge: 2015-05-23 | Disposition: A | Discharge status: Home / Self Care | Payer: Self-pay | Attending: Emergency Medicine | Admitting: Emergency Medicine

## 2015-05-23 DIAGNOSIS — Z862 Personal history of diseases of the blood and blood-forming organs and certain disorders involving the immune mechanism: Secondary | ICD-10-CM | POA: Insufficient documentation

## 2015-05-23 DIAGNOSIS — N898 Other specified noninflammatory disorders of vagina: Secondary | ICD-10-CM | POA: Insufficient documentation

## 2015-05-23 DIAGNOSIS — T8389XA Other specified complication of genitourinary prosthetic devices, implants and grafts, initial encounter: Secondary | ICD-10-CM | POA: Insufficient documentation

## 2015-05-23 DIAGNOSIS — Y658 Other specified misadventures during surgical and medical care: Secondary | ICD-10-CM | POA: Insufficient documentation

## 2015-05-23 DIAGNOSIS — Z975 Presence of (intrauterine) contraceptive device: Secondary | ICD-10-CM

## 2015-05-23 DIAGNOSIS — Z8619 Personal history of other infectious and parasitic diseases: Secondary | ICD-10-CM | POA: Insufficient documentation

## 2015-05-23 DIAGNOSIS — Z87891 Personal history of nicotine dependence: Secondary | ICD-10-CM | POA: Insufficient documentation

## 2015-05-23 NOTE — ED Notes (Signed)
Per pt, states implant in left upper arm-pain for over a month-wants it taken out

## 2015-05-23 NOTE — ED Notes (Signed)
Bed: WA31 Expected date:  Expected time:  Means of arrival:  Comments: 

## 2015-05-23 NOTE — Discharge Instructions (Signed)
Go to women's clinic to have Implanon removed. If you continue having vaginal discharge I recommend you have a repeat pelvic exam as well. Return here for new concerns.

## 2015-05-23 NOTE — ED Provider Notes (Signed)
CSN: [id number]     Arrival date & time 05/23/15  0916 History   First MD Initiated Contact with Patient 05/23/15 [phone]     Chief Complaint  Patient presents with  . implant pain      (Consider location/radiation/quality/duration/timing/severity/associated sxs/prior Treatment) The history is provided by the patient and medical records.    25 year old female with history of anemia and STDs, presenting to the ED for left upper arm pain. Patient states she has had her implanon has been in place for 3 years. She states it was best be removed a month ago, however her Medicaid expired and she no longer has coverage. She states she called the health department, however has to wait several months for this. She states is causing some discomfort in her left upper arm. She denies any swelling or fever. She denies any possibility of pregnancy currently.  Patient also had a question about the Flagyl she was supposed to take a few months ago. She states she never fully completed the course of antibiotics and is concerned she may still have an STD. She states she was told then she had trichomonas. She states she continues to have some abnormal vaginal discharge but denies any dysuria or abdominal pain.  Past Medical History  Diagnosis Date  . Anemia   . Chlamydia 2012  . Anemia    Past Surgical History  Procedure Laterality Date  . Cesarean section    . Tooth extraction  2010   Family History  Problem Relation Age of Onset  . Cancer Maternal Aunt   . Anesthesia problems Neg Hx   . Hypotension Neg Hx   . Malignant hyperthermia Neg Hx   . Pseudochol deficiency Neg Hx   . Other Neg Hx    Social History  Substance Use Topics  . Smoking status: Former Smoker -- 1.00 packs/day    Types: Cigarettes    Quit date: 07/12/2011  . Smokeless tobacco: Never Used  . Alcohol Use: No   OB History    Gravida Para Term Preterm AB TAB SAB Ectopic Multiple Living   Review of Systems   Skin:       Implant left arm  All other systems reviewed and are negative.     Allergies  Review of patient's allergies indicates no known allergies.  Home Medications   Prior to Admission medications   Medication Sig Start Date End Date Taking? Authorizing Provider  cyclobenzaprine (FLEXERIL) 5 MG tablet Take 1 tablet (5 mg total) by mouth 3 (three) times daily as needed for muscle spasms. Patient not taking: Reported on 04/06/2014 10/30/13   Charm Rings, MD  etonogestrel (IMPLANON) 68 MG IMPL implant 1 each by Subdermal route once. 05/2012    Historical Provider, MD  medroxyPROGESTERone (PROVERA) 10 MG tablet Take 1 tablet (10 mg total) by mouth daily. Patient not taking: Reported on 04/06/2014 09/29/13   Reuben Likes, MD  metroNIDAZOLE (FLAGYL) 500 MG tablet Take 1 tablet (500 mg total) by mouth 2 (two) times daily. One po bid x 7 days 11/23/14   Doug Sou, MD   BP 103/66 mmHg  Pulse 49  Temp(Src) 98.1 F (36.7 C) (Oral)  Resp 18  SpO2 100%  LMP 05/09/2015   Physical Exam  Constitutional: She is oriented to person, place, and time. She appears well-developed and well-nourished. No distress.  HENT:  Head: Normocephalic and atraumatic.  Mouth/Throat: Oropharynx  is clear and moist.  Eyes: Conjunctivae and EOM are normal. Pupils are equal, round, and reactive to light.  Neck: Normal range of motion. Neck supple.  Cardiovascular: Normal rate, regular rhythm and normal heart sounds.   Pulmonary/Chest: Effort normal and breath sounds normal. No respiratory distress. She has no wheezes.  Abdominal: Soft. Bowel sounds are normal.  Musculoskeletal: Normal range of motion.  Implanon in place of left upper arm, no surrounding erythema, swelling, or significant tenderness noted; neurovascularly intact  Neurological: She is alert and oriented to person, place, and time.  Skin: Skin is warm and dry. She is not diaphoretic.  Psychiatric: She has a normal mood and affect.  Nursing note  and vitals reviewed.   ED Course  Procedures (including critical care time) Labs Review Labs Reviewed - No data to display  Imaging Review No results found. I have personally reviewed and evaluated these images and lab results as part of my medical decision-making.   EKG Interpretation None      MDM   Final diagnoses:  Implanon in place  Vaginal discharge   25 year old female here with multiple complaints. She wants the Implanon from her left upper arm removed. I discussed with her that we do not do this here in the emergency department, rather she needs to follow-up with an OB/GYN or the health department.  In regards to her vaginal discharge, I have advised her that we can move her to an acute care bed to perform pelvic exam for re-check of STD given she did not finish antibiotics, however she declined and states she will wait until she sees OB-GYN to have this checked.  Given directions and number for womens clinic for follow-up.  Discussed plan with patient, he/she acknowledged understanding and agreed with plan of care.  Return precautions given for new or worsening symptoms.  Garlon Hatchet, PA-C 05/23/15 1133  Lorre Nick, MD 05/24/15 [phone]

## 2016-07-11 ENCOUNTER — Emergency Department (HOSPITAL_COMMUNITY)
Admission: EM | Admit: 2016-07-11 | Discharge: 2016-07-11 | Disposition: A | Discharge status: Home / Self Care | Payer: Medicaid Other | Attending: Emergency Medicine | Admitting: Emergency Medicine

## 2016-07-11 ENCOUNTER — Encounter (HOSPITAL_COMMUNITY): Payer: Self-pay | Admitting: Emergency Medicine

## 2016-07-11 DIAGNOSIS — N72 Inflammatory disease of cervix uteri: Secondary | ICD-10-CM | POA: Insufficient documentation

## 2016-07-11 DIAGNOSIS — Z87891 Personal history of nicotine dependence: Secondary | ICD-10-CM | POA: Insufficient documentation

## 2016-07-11 DIAGNOSIS — Z79899 Other long term (current) drug therapy: Secondary | ICD-10-CM | POA: Insufficient documentation

## 2016-07-11 LAB — URINALYSIS, ROUTINE W REFLEX MICROSCOPIC
BACTERIA UA: NONE SEEN
BILIRUBIN URINE: NEGATIVE
Glucose, UA: NEGATIVE mg/dL
Ketones, ur: NEGATIVE mg/dL
Leukocytes, UA: NEGATIVE
Nitrite: NEGATIVE
PH: 6 (ref 5.0–8.0)
Protein, ur: NEGATIVE mg/dL
Specific Gravity, Urine: 1.018 (ref 1.005–1.030)

## 2016-07-11 LAB — WET PREP, GENITAL
SPERM: NONE SEEN
TRICH WET PREP: NONE SEEN
YEAST WET PREP: NONE SEEN

## 2016-07-11 LAB — PREGNANCY, URINE: PREG TEST UR: NEGATIVE

## 2016-07-11 MED ORDER — CEFTRIAXONE SODIUM 250 MG IJ SOLR
250.0000 mg | INTRAMUSCULAR | Status: DC
  Administered 2016-07-11: 250 mg via INTRAMUSCULAR
  Filled 2016-07-11: qty 250

## 2016-07-11 MED ORDER — AZITHROMYCIN 250 MG PO TABS
1000.0000 mg | ORAL_TABLET | Freq: Once | ORAL | Status: AC
  Administered 2016-07-11: 1000 mg via ORAL
  Filled 2016-07-11: qty 4

## 2016-07-11 NOTE — ED Notes (Signed)
RN GETTING LABS 

## 2016-07-11 NOTE — ED Provider Notes (Signed)
[facility]-EMERGENCY DEPT Provider Note   CSN: [id number] Arrival date & time: 07/11/16  1032     History   Chief Complaint Chief Complaint  Patient presents with  . Abdominal Pain    HPI [name] is a 26 y.o. female. CC:    HPI:  Patient presents for evalautionOf vaginal discharge. States she is concerned that she has an STD. Has had orange discharge, and pain with intercourse. No fever. Had previous STDs.  Past Medical History:  Diagnosis Date  . Anemia   . Anemia   . Chlamydia 2012    Patient Active Problem List   Diagnosis Date Noted  . Hx of cesarean section complicating pregnancy 02/19/2012  . Echogenic focus of heart of fetus affecting antepartum care of mother 02/19/2012  . Supervision of high-risk pregnancy with insufficient prenatal care 02/19/2012  . Trichomonas vaginalis (TV) infection 01/31/2012  . Candida vaginitis 01/31/2012  . Round ligament pain 01/31/2012  . Hyperemesis gravidarum 08/14/2011    Past Surgical History:  Procedure Laterality Date  . CESAREAN SECTION    . TOOTH EXTRACTION  2010    OB History    Gravida Para Term Preterm AB Living   SAB TAB Ectopic Multiple Live Births           2       Home Medications    Prior to Admission medications   Medication Sig Start Date End Date Taking? Authorizing Provider  levonorgestrel (MIRENA) 20 MCG/24HR IUD 1 each by Intrauterine route once. Place 12/2015   Yes Historical Provider, MD    Family History Family History  Problem Relation Age of Onset  . Cancer Maternal Aunt   . Anesthesia problems Neg Hx   . Hypotension Neg Hx   . Malignant hyperthermia Neg Hx   . Pseudochol deficiency Neg Hx   . Other Neg Hx     Social History Social History  Substance Use Topics  . Smoking status: Former Smoker    Packs/day: 1.00    Types: Cigarettes    Quit date: 07/12/2011  . Smokeless tobacco: Never Used  . Alcohol use No     Allergies   Patient has no known  allergies.   Review of Systems Review of Systems  Constitutional: Negative for appetite change, chills, diaphoresis, fatigue and fever.  HENT: Negative for mouth sores, sore throat and trouble swallowing.   Eyes: Negative for visual disturbance.  Respiratory: Negative for cough, chest tightness, shortness of breath and wheezing.   Cardiovascular: Negative for chest pain.  Gastrointestinal: Negative for abdominal distention, abdominal pain, diarrhea, nausea and vomiting.  Endocrine: Negative for polydipsia, polyphagia and polyuria.  Genitourinary: Positive for vaginal discharge. Negative for dysuria, frequency and hematuria.  Musculoskeletal: Negative for gait problem.  Skin: Negative for color change, pallor and rash.  Neurological: Negative for dizziness, syncope, light-headedness and headaches.  Hematological: Does not bruise/bleed easily.  Psychiatric/Behavioral: Negative for behavioral problems and confusion.     Physical Exam Updated Vital Signs BP 114/70 (BP Location: Left Arm)   Pulse 63   Temp 97.9 F (36.6 C) (Oral)   Resp 16   SpO2 100%   Physical Exam  Constitutional: She is oriented to person, place, and time. She appears well-developed and well-nourished. No distress.  HENT:  Head: Normocephalic.  Eyes: Conjunctivae are normal. Pupils are equal, round, and reactive to light. No scleral icterus.  Neck: Normal range of motion. Neck supple. No thyromegaly  present.  Cardiovascular: Normal rate and regular rhythm.  Exam reveals no gallop and no friction rub.   No murmur heard. Pulmonary/Chest: Effort normal and breath sounds normal. No respiratory distress. She has no wheezes. She has no rales.  Abdominal: Soft. Bowel sounds are normal. She exhibits no distension. There is no tenderness. There is no rebound.  Musculoskeletal: Normal range of motion.  Neurological: She is alert and oriented to person, place, and time.  Skin: Skin is warm and dry. No rash noted.   Psychiatric: She has a normal mood and affect. Her behavior is normal.     ED Treatments / Results  Labs (all labs ordered are listed, but only abnormal results are displayed) Labs Reviewed  WET PREP, GENITAL - Abnormal; Notable for the following:       Result Value   Clue Cells Wet Prep HPF POC PRESENT (*)    WBC, Wet Prep HPF POC FEW (*)    All other components within normal limits  URINALYSIS, ROUTINE W REFLEX MICROSCOPIC - Abnormal; Notable for the following:    Hgb urine dipstick SMALL (*)    Squamous Epithelial / LPF 0-5 (*)    All other components within normal limits  PREGNANCY, URINE  GC/CHLAMYDIA PROBE AMP ([place]) NOT AT [facility]    EKG  EKG Interpretation None       Radiology No results found.  Procedures Procedures (including critical care time)  Medications Ordered in ED Medications  cefTRIAXone (ROCEPHIN) injection 250 mg (250 mg Intramuscular Given 07/11/16 1337)  azithromycin (ZITHROMAX) tablet 1,000 mg (1,000 mg Oral Given 07/11/16 1338)     Initial Impression / Assessment and Plan / ED Course  I have reviewed the triage vital signs and the nursing notes.  Pertinent labs & imaging results that were available during my care of the patient were reviewed by me and considered in my medical decision making (see chart for details).     Cervix inflamed. NoCMT. Pt concerned and high risk for STD. Tx for cervicitis with rocephin and Azithromycin.  Final Clinical Impressions(s) / ED Diagnoses   Final diagnoses:  Cervicitis    New Prescriptions New Prescriptions   No medications on file     Rolland Porter, MD 07/11/16 1353

## 2016-07-11 NOTE — ED Triage Notes (Addendum)
Pt reports intermittent orange vaginal bleeding, malodorous brown discharge, low back pain, low abdominal pain x 2-3 weeks. Not spotting before 2-3 weeks. Has Mirena IUD. No nausea, emesis. Burning at end of urination. Concerned for STI.

## 2016-07-11 NOTE — Discharge Instructions (Signed)
Sexual partners need to be evaluated and treated for STD.

## 2016-07-12 LAB — GC/CHLAMYDIA PROBE AMP (~~LOC~~) NOT AT ARMC
Chlamydia: NEGATIVE
NEISSERIA GONORRHEA: NEGATIVE

## 2016-09-03 ENCOUNTER — Ambulatory Visit (HOSPITAL_COMMUNITY)
Admission: EM | Admit: 2016-09-03 | Discharge: 2016-09-03 | Disposition: A | Discharge status: Home / Self Care | Payer: Medicaid Other | Attending: Internal Medicine | Admitting: Internal Medicine

## 2016-09-03 ENCOUNTER — Encounter (HOSPITAL_COMMUNITY): Payer: Self-pay | Admitting: Emergency Medicine

## 2016-09-03 DIAGNOSIS — R102 Pelvic and perineal pain: Secondary | ICD-10-CM

## 2016-09-03 DIAGNOSIS — R5381 Other malaise: Secondary | ICD-10-CM | POA: Insufficient documentation

## 2016-09-03 DIAGNOSIS — R110 Nausea: Secondary | ICD-10-CM | POA: Insufficient documentation

## 2016-09-03 DIAGNOSIS — R109 Unspecified abdominal pain: Secondary | ICD-10-CM | POA: Insufficient documentation

## 2016-09-03 DIAGNOSIS — B373 Candidiasis of vulva and vagina: Secondary | ICD-10-CM | POA: Insufficient documentation

## 2016-09-03 DIAGNOSIS — N898 Other specified noninflammatory disorders of vagina: Secondary | ICD-10-CM

## 2016-09-03 DIAGNOSIS — Z87891 Personal history of nicotine dependence: Secondary | ICD-10-CM | POA: Insufficient documentation

## 2016-09-03 MED ORDER — AZITHROMYCIN 250 MG PO TABS
1000.0000 mg | ORAL_TABLET | Freq: Once | ORAL | Status: AC
  Administered 2016-09-03: 1000 mg via ORAL

## 2016-09-03 MED ORDER — CEFTRIAXONE SODIUM 250 MG IJ SOLR
250.0000 mg | Freq: Once | INTRAMUSCULAR | Status: AC
  Administered 2016-09-03: 250 mg via INTRAMUSCULAR

## 2016-09-03 MED ORDER — AZITHROMYCIN 250 MG PO TABS
ORAL_TABLET | ORAL | Status: AC
  Filled 2016-09-03: qty 4

## 2016-09-03 MED ORDER — CEFTRIAXONE SODIUM 250 MG IJ SOLR
INTRAMUSCULAR | Status: AC
  Filled 2016-09-03: qty 250

## 2016-09-03 NOTE — ED Provider Notes (Signed)
[facility]    CSN: [id number] Arrival date & time: 09/03/16  [id number]     History   Chief Complaint Chief Complaint  Patient presents with  . Abdominal Pain    HPI [name] is a 26 y.o. female. She has been having some spotting/brownish discharge, urinary frequency, vague intermittent pelvic discomfort, for about the last 2-3 weeks. She has been involved with her "baby daddy" again, and thinks this might be the reason. No fever. Does have malaise. Nausea, no vomiting. No change in bowel habits except maybe consistency is a little softer.    HPI  Past Medical History:  Diagnosis Date  . Anemia   . Anemia   . Chlamydia 2012    Patient Active Problem List   Diagnosis Date Noted  . Hx of cesarean section complicating pregnancy 02/19/2012  . Echogenic focus of heart of fetus affecting antepartum care of mother 02/19/2012  . Supervision of high-risk pregnancy with insufficient prenatal care 02/19/2012  . Trichomonas vaginalis (TV) infection 01/31/2012  . Candida vaginitis 01/31/2012  . Round ligament pain 01/31/2012  . Hyperemesis gravidarum 08/14/2011    Past Surgical History:  Procedure Laterality Date  . CESAREAN SECTION    . TOOTH EXTRACTION  2010    OB History    Gravida Para Term Preterm AB Living   2 2 2     2    SAB TAB Ectopic Multiple Live Births           2       Home Medications    Prior to Admission medications   Medication Sig Start Date End Date Taking? Authorizing Provider  levonorgestrel (MIRENA) 20 MCG/24HR IUD 1 each by Intrauterine route once. Place 12/2015   Yes [provider]    Family History Family History  Problem Relation Age of Onset  . Cancer Maternal Aunt   . Anesthesia problems Neg Hx   . Hypotension Neg Hx   . Malignant hyperthermia Neg Hx   . Pseudochol deficiency Neg Hx   . Other Neg Hx     Social History Social History  Substance Use Topics  . Smoking status: Former Smoker    Packs/day: 1.00     Types: Cigarettes    Quit date: 07/12/2011  . Smokeless tobacco: Never Used  . Alcohol use No     Allergies   Patient has no known allergies.   Review of Systems Review of Systems  All other systems reviewed and are negative.    Physical Exam Triage Vital Signs ED Triage Vitals [09/03/16 1008]  Enc Vitals Group     BP (!) 97/59     Pulse Rate 64     Resp 20     Temp 97.7 F (36.5 C)     Temp Source Oral     SpO2 99 %     Weight      Height      Pain Score      Pain Loc    Updated Vital Signs BP (!) 97/59 (BP Location: Right Arm)   Pulse 64   Temp 97.7 F (36.5 C) (Oral)   Resp 20   LMP 08/13/2016   SpO2 99%   Physical Exam  Constitutional: She is oriented to person, place, and time. No distress.  HENT:  Head: Atraumatic.  Eyes:  Conjugate gaze observed, no eye redness/discharge  Neck: Neck supple.  Cardiovascular: Normal rate and regular rhythm.   Pulmonary/Chest: No respiratory distress. She has no  wheezes. She has no rales.  Lungs clear, symmetric breath sounds  Abdominal: Soft. She exhibits no distension. There is no tenderness. There is no rebound and no guarding.  Musculoskeletal: Normal range of motion.  Neurological: She is alert and oriented to person, place, and time.  Skin: Skin is warm and dry.  Nursing note and vitals reviewed.    UC Treatments / Results  Labs Results for orders placed or performed during the hospital encounter of 07/11/16  Wet prep, genital  Result Value Ref Range   Yeast Wet Prep HPF POC NONE SEEN NONE SEEN   Trich, Wet Prep NONE SEEN NONE SEEN   Clue Cells Wet Prep HPF POC PRESENT (A) NONE SEEN   WBC, Wet Prep HPF POC FEW (A) NONE SEEN   Sperm NONE SEEN   Urinalysis, Routine w reflex microscopic  Result Value Ref Range   Color, Urine YELLOW YELLOW   APPearance CLEAR CLEAR   Specific Gravity, Urine 1.018 1.005 - 1.030   pH 6.0 5.0 - 8.0   Glucose, UA NEGATIVE NEGATIVE mg/dL   Hgb urine dipstick SMALL (A)  NEGATIVE   Bilirubin Urine NEGATIVE NEGATIVE   Ketones, ur NEGATIVE NEGATIVE mg/dL   Protein, ur NEGATIVE NEGATIVE mg/dL   Nitrite NEGATIVE NEGATIVE   Leukocytes, UA NEGATIVE NEGATIVE   RBC / HPF 0-5 0 - 5 RBC/hpf   WBC, UA 0-5 0 - 5 WBC/hpf   Bacteria, UA NONE SEEN NONE SEEN   Squamous Epithelial / LPF 0-5 (A) NONE SEEN   Mucous PRESENT   Pregnancy, urine  Result Value Ref Range   Preg Test, Ur NEGATIVE NEGATIVE  GC/Chlamydia probe amp ([place])not at [facility]  Result Value Ref Range   Chlamydia Negative    Neisseria gonorrhea Negative     Procedures Procedures (including critical care time)  Medications Ordered in UC Medications  cefTRIAXone (ROCEPHIN) injection 250 mg (250 mg Intramuscular Given 09/03/16 1046)  azithromycin (ZITHROMAX) tablet 1,000 mg (1,000 mg Oral Given 09/03/16 1045)     Final Clinical Impressions(s) / UC Diagnoses   Final diagnoses:  Vaginal discharge   Tests for common causes of vaginal irritation are pending.  Injection of rocephin and oral dose of zithromax given at urgent care today.  The urgent care will contact you if further treatment is needed.      Eustace Moore, MD 09/04/16 [phone]

## 2016-09-03 NOTE — Discharge Instructions (Addendum)
Tests for common causes of vaginal irritation are pending.  Injection of rocephin and oral dose of zithromax given at urgent care today.  The urgent care will contact you if further treatment is needed.

## 2016-09-03 NOTE — ED Triage Notes (Signed)
Pt c/o intermittent abd pain onset 2 weeks... Concerned for STDs since she got back w/her "baby daddy"  Pt sexually active w/occasional use of condom  Denies vag d/c, irritation, urinary sx, fevers, v/n/d  A&O x4... NAD... Ambulatory

## 2016-09-05 LAB — URINE CYTOLOGY ANCILLARY ONLY
Chlamydia: NEGATIVE
Neisseria Gonorrhea: NEGATIVE
Trichomonas: NEGATIVE

## 2016-09-06 LAB — URINE CYTOLOGY ANCILLARY ONLY: CANDIDA VAGINITIS: NEGATIVE

## 2016-09-07 ENCOUNTER — Telehealth (HOSPITAL_COMMUNITY): Payer: Self-pay | Admitting: Internal Medicine

## 2016-09-07 MED ORDER — METRONIDAZOLE 500 MG PO TABS: 500.0000 mg | ORAL_TABLET | Freq: Two times a day (BID) | ORAL | 0 refills | Status: DC

## 2016-09-07 NOTE — Telephone Encounter (Signed)
Please let patient know that test for gardnerella (bacterial vaginosis) was positive.  Rx metronidazole was sent to the pharmacy of record, [facility] on [address] [place].  Recheck or followup with PCP for further evaluation if symptoms are not improving.  LM

## 2017-01-22 ENCOUNTER — Ambulatory Visit (HOSPITAL_COMMUNITY)
Admission: EM | Admit: 2017-01-22 | Discharge: 2017-01-22 | Disposition: A | Discharge status: Home / Self Care | Payer: Self-pay | Attending: Family Medicine | Admitting: Family Medicine

## 2017-01-22 ENCOUNTER — Encounter (HOSPITAL_COMMUNITY): Payer: Self-pay | Admitting: Emergency Medicine

## 2017-01-22 DIAGNOSIS — B9689 Other specified bacterial agents as the cause of diseases classified elsewhere: Secondary | ICD-10-CM

## 2017-01-22 DIAGNOSIS — N760 Acute vaginitis: Secondary | ICD-10-CM

## 2017-01-22 LAB — POCT URINALYSIS DIP (DEVICE)
GLUCOSE, UA: NEGATIVE mg/dL
Ketones, ur: NEGATIVE mg/dL
Nitrite: NEGATIVE
PROTEIN: NEGATIVE mg/dL
UROBILINOGEN UA: 0.2 mg/dL (ref 0.0–1.0)
pH: 6 (ref 5.0–8.0)

## 2017-01-22 MED ORDER — METRONIDAZOLE 500 MG PO TABS: 500.0000 mg | ORAL_TABLET | Freq: Two times a day (BID) | ORAL | 0 refills | Status: DC

## 2017-01-22 NOTE — ED Triage Notes (Signed)
Pt initially reported lower back pain, but then stated she probably has a bacterial infection because she never properly took her medications back in June when she was diagnosed with BV.  Pt reports still having same symptoms and states her back is always hurting.  Pt is a poor historian of her symptoms.

## 2017-01-23 NOTE — ED Provider Notes (Signed)
[facility]   [id number] 01/22/17 Arrival Time: 1518  ASSESSMENT & PLAN:   1. BV (bacterial vaginosis)    Meds ordered this encounter  Medications  . metroNIDAZOLE (FLAGYL) 500 MG tablet    Sig: Take 1 tablet (500 mg total) by mouth 2 (two) times daily.    Dispense:  14 tablet    Refill:  0   Declines testing for STD. Will f/u if not improving on Flagyl.  Reviewed expectations re: course of current medical issues. Questions answered. Outlined signs and symptoms indicating need for more acute intervention. Patient verbalized understanding. After Visit Summary given.   SUBJECTIVE:  [name] is a 26 y.o. female who presents with complaint of vaginal discharge on and off ofr the past few months. Was prescribed Flagyl but did not take properly. H/O BV. "Definitely BV. I know I don't have anything else." Describes discharge as this with a fishy odor. Urinary symptoms: none. Afebrile. No abdominal or pelvic pain. No n/v. No rashes or lesions. Sexually active with single female partner(s). Occasional condom use. OTC treatment: None. History of similar symptoms: Yes .  No LMP recorded. Patient is not currently having periods (Reason: IUD).  ROS: As per HPI.  OBJECTIVE:  Vitals:   01/22/17 1533  BP: 102/64  Pulse: 77  Temp: 97.8 F (36.6 C)  TempSrc: Oral  SpO2: 100%    General appearance: alert, cooperative, appears stated age and no distress Throat: lips, mucosa, and tongue normal; teeth and gums normal Back: no CVA tenderness Abdomen: soft, non-tender; bowel sounds normal; no masses or organomegaly; no guarding or rebound tenderness GU: declines Skin: warm and dry Psychological:  Alert and cooperative. Normal mood and affect.  Results for orders placed or performed during the hospital encounter of 01/22/17  POCT urinalysis dip (device)  Result Value Ref Range   Glucose, UA NEGATIVE NEGATIVE mg/dL   Bilirubin Urine SMALL (A) NEGATIVE   Ketones, ur  NEGATIVE NEGATIVE mg/dL   Specific Gravity, Urine >=1.030 1.005 - 1.030   Hgb urine dipstick TRACE (A) NEGATIVE   pH 6.0 5.0 - 8.0   Protein, ur NEGATIVE NEGATIVE mg/dL   Urobilinogen, UA 0.2 0.0 - 1.0 mg/dL   Nitrite NEGATIVE NEGATIVE   Leukocytes, UA TRACE (A) NEGATIVE    Labs Reviewed  POCT URINALYSIS DIP (DEVICE) - Abnormal; Notable for the following:       Result Value   Bilirubin Urine SMALL (*)    Hgb urine dipstick TRACE (*)    Leukocytes, UA TRACE (*)    All other components within normal limits    No Known Allergies  Past Medical History:  Diagnosis Date  . Anemia   . Anemia   . Chlamydia 2012   Family History  Problem Relation Age of Onset  . Cancer Maternal Aunt   . Anesthesia problems Neg Hx   . Hypotension Neg Hx   . Malignant hyperthermia Neg Hx   . Pseudochol deficiency Neg Hx   . Other Neg Hx    Social History   Social History  . Marital status: Single    Spouse name: N/A  . Number of children: N/A  . Years of education: N/A   Occupational History  . Not on file.   Social History Main Topics  . Smoking status: Former Smoker    Packs/day: 1.00    Types: Cigarettes    Quit date: 07/12/2011  . Smokeless tobacco: Never Used  . Alcohol use No  . Drug use:  No  . Sexual activity: Not Currently   Other Topics Concern  . Not on file   Social History Narrative  . No narrative on file          Mardella LaymanHagler, Moyses Pavey, MD 01/23/17 [phone]

## 2017-01-28 ENCOUNTER — Emergency Department (HOSPITAL_COMMUNITY): Admission: EM | Admit: 2017-01-28 | Discharge: 2017-01-28 | Discharge status: Against Medical Advice | Payer: Self-pay

## 2017-01-28 NOTE — ED Notes (Signed)
Called pt name x3 for triage. No response. 

## 2017-02-12 ENCOUNTER — Encounter (HOSPITAL_COMMUNITY): Payer: Self-pay | Admitting: Family Medicine

## 2017-02-12 ENCOUNTER — Ambulatory Visit (HOSPITAL_COMMUNITY)
Admission: EM | Admit: 2017-02-12 | Discharge: 2017-02-12 | Disposition: A | Discharge status: Home / Self Care | Payer: Self-pay | Attending: Family Medicine | Admitting: Family Medicine

## 2017-02-12 ENCOUNTER — Other Ambulatory Visit: Payer: Self-pay

## 2017-02-12 DIAGNOSIS — H9201 Otalgia, right ear: Secondary | ICD-10-CM

## 2017-02-12 NOTE — ED Triage Notes (Signed)
Right ear pain for 3 days. 

## 2017-02-15 NOTE — ED Provider Notes (Signed)
  [facility]   [id number] 02/12/17 Arrival Time: 1915  ASSESSMENT & PLAN:  1. Right ear pain    Normal exam. Question related to dental issue. May use OTC analgesics.  Dental resource written instructions given. She will schedule dental evaluation as soon as possible.  Reviewed expectations re: course of current medical issues. Questions answered. Outlined signs and symptoms indicating need for more acute intervention. Patient verbalized understanding. After Visit Summary given.   SUBJECTIVE:  Emily Panderrica Opheim is a 26 y.o. female who reports gradual onset of R ear discomfort. Present for 3 days. No recent illness. No hearing changes or ear drainage. No OTC treatment. Occasional tooth pain reported; right-sided. Normal PO intake. No n/v.  ROS: As per HPI.  OBJECTIVE:  Vitals:   02/12/17 1952  BP: 92/61  Pulse: 81  Resp: 18  Temp: 99.3 F (37.4 C)  TempSrc: Oral  SpO2: 99%    General appearance: alert; no distress HENT: normocephalic; atraumatic; dentition: fair; gums normal; no tenderness; TMs normal bilaterally Neck: supple without LAD Lungs: normal respirations Skin: warm and dry Psychological: alert and cooperative; normal mood and affect  No Known Allergies  Past Medical History:  Diagnosis Date  . Anemia   . Anemia   . Chlamydia 2012   Social History   Socioeconomic History  . Marital status: Single    Spouse name: Not on file  . Number of children: Not on file  . Years of education: Not on file  . Highest education level: Not on file  Social Needs  . Financial resource strain: Not on file  . Food insecurity - worry: Not on file  . Food insecurity - inability: Not on file  . Transportation needs - medical: Not on file  . Transportation needs - non-medical: Not on file  Occupational History  . Not on file  Tobacco Use  . Smoking status: Former Smoker    Packs/day: 1.00    Types: Cigarettes    Last attempt to quit: 07/12/2011    Years  since quitting: 5.6  . Smokeless tobacco: Never Used  Substance and Sexual Activity  . Alcohol use: No  . Drug use: No  . Sexual activity: Not Currently  Other Topics Concern  . Not on file  Social History Narrative  . Not on file   Family History  Problem Relation Age of Onset  . Cancer Maternal Aunt   . Anesthesia problems Neg Hx   . Hypotension Neg Hx   . Malignant hyperthermia Neg Hx   . Pseudochol deficiency Neg Hx   . Other Neg Hx    Past Surgical History:  Procedure Laterality Date  . CESAREAN SECTION    . TOOTH EXTRACTION  2010     Mardella LaymanHagler, Leamon Palau, MD 02/15/17 1201

## 2017-03-16 ENCOUNTER — Emergency Department (HOSPITAL_COMMUNITY)
Admission: EM | Admit: 2017-03-16 | Discharge: 2017-03-16 | Disposition: A | Discharge status: Home / Self Care | Payer: Self-pay | Attending: Emergency Medicine | Admitting: Emergency Medicine

## 2017-03-16 ENCOUNTER — Encounter (HOSPITAL_COMMUNITY): Payer: Self-pay | Admitting: Emergency Medicine

## 2017-03-16 DIAGNOSIS — N898 Other specified noninflammatory disorders of vagina: Secondary | ICD-10-CM

## 2017-03-16 DIAGNOSIS — B9689 Other specified bacterial agents as the cause of diseases classified elsewhere: Secondary | ICD-10-CM | POA: Insufficient documentation

## 2017-03-16 DIAGNOSIS — Z87891 Personal history of nicotine dependence: Secondary | ICD-10-CM | POA: Insufficient documentation

## 2017-03-16 DIAGNOSIS — N760 Acute vaginitis: Secondary | ICD-10-CM

## 2017-03-16 LAB — WET PREP, GENITAL
Sperm: NONE SEEN
Trich, Wet Prep: NONE SEEN
YEAST WET PREP: NONE SEEN

## 2017-03-16 LAB — I-STAT BETA HCG BLOOD, ED (MC, WL, AP ONLY): I-stat hCG, quantitative: <5 m[IU]/mL (ref <5)

## 2017-03-16 MED ORDER — CEFTRIAXONE SODIUM 250 MG IJ SOLR
250.0000 mg | Freq: Once | INTRAMUSCULAR | Status: AC
  Administered 2017-03-16: 250 mg via INTRAMUSCULAR
  Filled 2017-03-16: qty 250

## 2017-03-16 MED ORDER — LIDOCAINE HCL (PF) 1 % IJ SOLN
INTRAMUSCULAR | Status: AC
  Administered 2017-03-16: 5 mL
  Filled 2017-03-16: qty 5

## 2017-03-16 MED ORDER — AZITHROMYCIN 250 MG PO TABS
1000.0000 mg | ORAL_TABLET | Freq: Once | ORAL | Status: AC
  Administered 2017-03-16: 1000 mg via ORAL
  Filled 2017-03-16: qty 4

## 2017-03-16 MED ORDER — METRONIDAZOLE 500 MG PO TABS: 500.0000 mg | ORAL_TABLET | Freq: Two times a day (BID) | ORAL | 0 refills | Status: DC

## 2017-03-16 NOTE — ED Provider Notes (Addendum)
[facility] Provider Note   CSN: [id number] Arrival date & time: 03/16/17  0804     History   Chief Complaint Chief Complaint  Patient presents with  . Vaginitis    HPI [name] is a 26 y.o. female.  Patient with vaginal discharge starting today.  Patient states it is white in nature.  There is some itching associated with it.  She also has concern for STD.  Patient 3 weeks ago was treated for bacterial vaginosis.      Past Medical History:  Diagnosis Date  . Anemia   . Anemia   . Chlamydia 2012    Patient Active Problem List   Diagnosis Date Noted  . Hx of cesarean section complicating pregnancy 02/19/2012  . Echogenic focus of heart of fetus affecting antepartum care of mother 02/19/2012  . Supervision of high-risk pregnancy with insufficient prenatal care 02/19/2012  . Trichomonas vaginalis (TV) infection 01/31/2012  . Candida vaginitis 01/31/2012  . Round ligament pain 01/31/2012  . Hyperemesis gravidarum 08/14/2011    Past Surgical History:  Procedure Laterality Date  . CESAREAN SECTION    . TOOTH EXTRACTION  2010    OB History    Gravida Para Term Preterm AB Living   2 2 2     2    SAB TAB Ectopic Multiple Live Births           2       Home Medications    Prior to Admission medications   Medication Sig Start Date End Date Taking? Authorizing Provider  levonorgestrel (MIRENA) 20 MCG/24HR IUD 1 each by Intrauterine route once. Place 12/2015    [provider]  metroNIDAZOLE (FLAGYL) 500 MG tablet Take 1 tablet (500 mg total) by mouth 2 (two) times daily. 01/22/17   Mardella LaymanHagler, Brian, MD  metroNIDAZOLE (FLAGYL) 500 MG tablet Take 1 tablet (500 mg total) by mouth 2 (two) times daily. 03/16/17   Vanetta MuldersZackowski, Ayisha Pol, MD    Family History Family History  Problem Relation Age of Onset  . Cancer Maternal Aunt   . Anesthesia problems Neg Hx   . Hypotension Neg Hx   . Malignant hyperthermia Neg Hx   .  Pseudochol deficiency Neg Hx   . Other Neg Hx     Social History Social History   Tobacco Use  . Smoking status: Former Smoker    Packs/day: 1.00    Types: Cigarettes    Last attempt to quit: 07/12/2011    Years since quitting: 5.6  . Smokeless tobacco: Never Used  Substance Use Topics  . Alcohol use: No  . Drug use: No     Allergies   Patient has no known allergies.   Review of Systems Review of Systems  Constitutional: Negative for fever.  HENT: Negative for congestion.   Eyes: Negative for redness.  Respiratory: Negative for shortness of breath.   Cardiovascular: Negative for chest pain.  Gastrointestinal: Negative for abdominal pain.  Genitourinary: Positive for vaginal discharge. Negative for dysuria and vaginal bleeding.  Musculoskeletal: Negative for back pain.  Skin: Negative for rash.  Neurological: Negative for headaches.  Hematological: Does not bruise/bleed easily.  Psychiatric/Behavioral: Negative for confusion.     Physical Exam Updated Vital Signs BP (!) 95/54 (BP Location: Left Arm)   Pulse (!) 115   Temp 98.3 F (36.8 C) (Oral)   Resp 18   Ht 1.702 m (5\' 7" )   Wt 49.9 kg (110 lb)   SpO2 98%  BMI 17.23 kg/m   Physical Exam  Constitutional: She appears well-developed and well-nourished.  HENT:  Head: Normocephalic and atraumatic.  Eyes: Conjunctivae and EOM are normal. Pupils are equal, round, and reactive to light.  Neck: Neck supple.  Cardiovascular: Normal rate, regular rhythm and normal heart sounds.  Pulmonary/Chest: Effort normal and breath sounds normal.  Abdominal: Soft. Bowel sounds are normal. There is no tenderness.  Genitourinary: Uterus normal. Vaginal discharge found.  Genitourinary Comments: No cervical motion tenderness.  Uterus nontender adnexa nontender.  Yellow white discharge vaginal area seems to be coming from the cervix.  No external genitalia lesions.  Musculoskeletal: Normal range of motion.  Skin: No rash  noted.  Nursing note and vitals reviewed.    ED Treatments / Results  Labs (all labs ordered are listed, but only abnormal results are displayed) Labs Reviewed  WET PREP, GENITAL - Abnormal; Notable for the following components:      Result Value   Clue Cells Wet Prep HPF POC PRESENT (*)    WBC, Wet Prep HPF POC MODERATE (*)    All other components within normal limits  RPR  HIV ANTIBODY (ROUTINE TESTING)  I-STAT BETA HCG BLOOD, ED ([facility], [facility], [facility] ONLY)  GC/CHLAMYDIA PROBE AMP ([place]    EKG  EKG Interpretation None       Radiology No results found.  Procedures Procedures (including critical care time)  Medications Ordered in ED Medications  cefTRIAXone (ROCEPHIN) injection 250 mg (not administered)  azithromycin (ZITHROMAX) tablet 1,000 mg (not administered)     Initial Impression / Assessment and Plan / ED Course  I have reviewed the triage vital signs and the nursing notes.  Pertinent labs & imaging results that were available during my care of the patient were reviewed by me and considered in my medical decision making (see chart for details).    Pregnancy test negative.  Patient treated for possible STD exposure here with Rocephin and Zithromax.  Wet prep shows evidence of bacterial vaginosis.  Patient does have a pretty significant discharge.  No evidence of trichomoniasis.  Will treat with Flagyl.  No evidence of PID clinically.  Final Clinical Impressions(s) / ED Diagnoses   Final diagnoses:  Vaginal discharge  BV (bacterial vaginosis)    ED Discharge Orders        Ordered    metroNIDAZOLE (FLAGYL) 500 MG tablet  2 times daily     03/16/17 0905       Vanetta MuldersZackowski, Teren Franckowiak, MD 03/16/17 [id number]    Vanetta MuldersZackowski, Paige Monarrez, MD 03/16/17 [phone]

## 2017-03-16 NOTE — Discharge Instructions (Signed)
Take the Flagyl as directed.  Return for any new or worse symptoms or if there is no improvement over the next several days.  Vaginal cultures are pending.

## 2017-03-16 NOTE — ED Triage Notes (Signed)
Patient c/o white vaginal discharge and itching after treatment for BV.

## 2017-03-16 NOTE — ED Notes (Signed)
Pt provided labeled specimen cup for U/A when able to use restroom/if ordered. [employer]

## 2017-03-17 LAB — HIV ANTIBODY (ROUTINE TESTING W REFLEX): HIV SCREEN 4TH GENERATION: NONREACTIVE

## 2017-03-17 LAB — GC/CHLAMYDIA PROBE AMP (~~LOC~~) NOT AT ARMC
CHLAMYDIA, DNA PROBE: NEGATIVE
NEISSERIA GONORRHEA: NEGATIVE

## 2017-03-17 LAB — RPR: RPR: NONREACTIVE

## 2017-03-30 ENCOUNTER — Emergency Department (HOSPITAL_COMMUNITY)
Admission: EM | Admit: 2017-03-30 | Discharge: 2017-03-30 | Disposition: A | Discharge status: Home / Self Care | Payer: Self-pay | Attending: Emergency Medicine | Admitting: Emergency Medicine

## 2017-03-30 DIAGNOSIS — R1110 Vomiting, unspecified: Secondary | ICD-10-CM | POA: Insufficient documentation

## 2017-03-30 DIAGNOSIS — R1111 Vomiting without nausea: Secondary | ICD-10-CM

## 2017-03-30 DIAGNOSIS — Z87891 Personal history of nicotine dependence: Secondary | ICD-10-CM | POA: Insufficient documentation

## 2017-03-30 LAB — COMPREHENSIVE METABOLIC PANEL
ALBUMIN: 4.6 g/dL (ref 3.5–5.0)
ALK PHOS: 41 U/L (ref 38–126)
ALT: 14 U/L (ref 14–54)
AST: 23 U/L (ref 15–41)
Anion gap: 6 (ref 5–15)
BILIRUBIN TOTAL: 0.5 mg/dL (ref 0.3–1.2)
BUN: 9 mg/dL (ref 6–20)
CALCIUM: 9.4 mg/dL (ref 8.9–10.3)
CO2: 23 mmol/L (ref 22–32)
CREATININE: 0.63 mg/dL (ref 0.44–1.00)
Chloride: 108 mmol/L (ref 101–111)
GFR calc Af Amer: >60 mL/min (ref >60)
GFR calc non Af Amer: >60 mL/min (ref >60)
GLUCOSE: 99 mg/dL (ref 65–99)
Potassium: 3.6 mmol/L (ref 3.5–5.1)
SODIUM: 137 mmol/L (ref 135–145)
TOTAL PROTEIN: 8 g/dL (ref 6.5–8.1)

## 2017-03-30 LAB — CBC
HCT: 39.6 % (ref 36.0–46.0)
HEMOGLOBIN: 13.4 g/dL (ref 12.0–15.0)
MCH: 28.9 pg (ref 26.0–34.0)
MCHC: 33.8 g/dL (ref 30.0–36.0)
MCV: 85.5 fL (ref 78.0–100.0)
PLATELETS: 183 10*3/uL (ref 150–400)
RBC: 4.63 MIL/uL (ref 3.87–5.11)
RDW: 13.8 % (ref 11.5–15.5)
WBC: 3.9 10*3/uL — ABNORMAL LOW (ref 4.0–10.5)

## 2017-03-30 LAB — I-STAT BETA HCG BLOOD, ED (MC, WL, AP ONLY): I-stat hCG, quantitative: <5 m[IU]/mL (ref <5)

## 2017-03-30 LAB — LIPASE, BLOOD: Lipase: 28 U/L (ref 11–51)

## 2017-03-30 MED ORDER — ONDANSETRON HCL 4 MG/2ML IJ SOLN
4.0000 mg | Freq: Once | INTRAMUSCULAR | Status: AC
  Administered 2017-03-30: 4 mg via INTRAVENOUS
  Filled 2017-03-30: qty 2

## 2017-03-30 MED ORDER — ONDANSETRON 4 MG PO TBDP: 4.0000 mg | ORAL_TABLET | Freq: Three times a day (TID) | ORAL | 0 refills | Status: DC | PRN

## 2017-03-30 NOTE — ED Triage Notes (Signed)
Transported by [facility] from home; sudden onset of N/V that started at 8 am. Patient reports fever (unchecked) at home and chills. Denies any abdominal pain. VSS with EMS.

## 2017-03-30 NOTE — ED Provider Notes (Signed)
[facility] Provider Note   CSN: [id number] Arrival date & time: 03/30/17  1410     History   Chief Complaint Chief Complaint  Patient presents with  . Emesis    HPI [name] is a 26 y.o. female.  The history is provided by the patient. No language interpreter was used.  Emesis   This is a new problem. The current episode started 6 to 12 hours ago. The problem occurs 2 to 4 times per day. The problem has not changed since onset.There has been no fever. Associated symptoms include abdominal pain. Pertinent negatives include no chills, no cough, no fever, no headaches and no URI.   Pt reports vomiting several times today.  Pt thinks she may have a stomach bug.  Pt reports no questionable food.  Past Medical History:  Diagnosis Date  . Anemia   . Anemia   . Chlamydia 2012    Patient Active Problem List   Diagnosis Date Noted  . Hx of cesarean section complicating pregnancy 02/19/2012  . Echogenic focus of heart of fetus affecting antepartum care of mother 02/19/2012  . Supervision of high-risk pregnancy with insufficient prenatal care 02/19/2012  . Trichomonas vaginalis (TV) infection 01/31/2012  . Candida vaginitis 01/31/2012  . Round ligament pain 01/31/2012  . Hyperemesis gravidarum 08/14/2011    Past Surgical History:  Procedure Laterality Date  . CESAREAN SECTION    . TOOTH EXTRACTION  2010    OB History    Gravida Para Term Preterm AB Living   2 2 2     2    SAB TAB Ectopic Multiple Live Births           2       Home Medications    Prior to Admission medications   Medication Sig Start Date End Date Taking? Authorizing Provider  levonorgestrel (MIRENA) 20 MCG/24HR IUD 1 each by Intrauterine route once. Place 12/2015   Yes [provider]  metroNIDAZOLE (FLAGYL) 500 MG tablet Take 1 tablet (500 mg total) by mouth 2 (two) times daily. Patient not taking: Reported on 03/30/2017 01/22/17   Mardella LaymanHagler, Brian, MD    metroNIDAZOLE (FLAGYL) 500 MG tablet Take 1 tablet (500 mg total) by mouth 2 (two) times daily. Patient not taking: Reported on 03/30/2017 03/16/17   Vanetta MuldersZackowski, Scott, MD  ondansetron (ZOFRAN ODT) 4 MG disintegrating tablet Take 1 tablet (4 mg total) by mouth every 8 (eight) hours as needed for nausea or vomiting. 03/30/17   Elson AreasSofia, Leslie K, PA-C    Family History Family History  Problem Relation Age of Onset  . Cancer Maternal Aunt   . Anesthesia problems Neg Hx   . Hypotension Neg Hx   . Malignant hyperthermia Neg Hx   . Pseudochol deficiency Neg Hx   . Other Neg Hx     Social History Social History   Tobacco Use  . Smoking status: Former Smoker    Packs/day: 1.00    Types: Cigarettes    Last attempt to quit: 07/12/2011    Years since quitting: 5.7  . Smokeless tobacco: Never Used  Substance Use Topics  . Alcohol use: No  . Drug use: No     Allergies   Patient has no known allergies.   Review of Systems Review of Systems  Constitutional: Negative for chills and fever.  Respiratory: Negative for cough.   Gastrointestinal: Positive for abdominal pain and vomiting.  Neurological: Negative for headaches.  All other systems reviewed and  are negative.    Physical Exam Updated Vital Signs BP 102/65 (BP Location: Left Arm)   Pulse 70   Temp 97.7 F (36.5 C) (Oral)   Resp 16   Ht 5\' 7"  (1.702 m)   Wt 49.9 kg (110 lb)   SpO2 98%   BMI 17.23 kg/m   Physical Exam  Constitutional: She appears well-developed and well-nourished.  HENT:  Head: Normocephalic.  Right Ear: External ear normal.  Left Ear: External ear normal.  Nose: Nose normal.  Mouth/Throat: Oropharynx is clear and moist.  Eyes: Conjunctivae and EOM are normal. Pupils are equal, round, and reactive to light.  Cardiovascular: Normal rate.  Pulmonary/Chest: Effort normal.  Musculoskeletal: Normal range of motion.  Neurological: She is alert.  Skin: Skin is warm.  Psychiatric: She has a normal  mood and affect.  Nursing note and vitals reviewed.    ED Treatments / Results  Labs (all labs ordered are listed, but only abnormal results are displayed) Labs Reviewed  CBC - Abnormal; Notable for the following components:      Result Value   WBC 3.9 (*)    All other components within normal limits  LIPASE, BLOOD  COMPREHENSIVE METABOLIC PANEL  URINALYSIS, ROUTINE W REFLEX MICROSCOPIC  I-STAT BETA HCG BLOOD, ED ([facility], [facility], [facility] ONLY)    EKG  EKG Interpretation None       Radiology No results found.  Procedures Procedures (including critical care time)  Medications Ordered in ED Medications  ondansetron (ZOFRAN) injection 4 mg (4 mg Intravenous Given 03/30/17 1454)     Initial Impression / Assessment and Plan / ED Course  I have reviewed the triage vital signs and the nursing notes.  Pertinent labs & imaging results that were available during my care of the patient were reviewed by me and considered in my medical decision making (see chart for details).     Pt given Iv fluids x 1 liter.  Pt reports she feels better.  Pt tolerated fluids without vomiting.  Final Clinical Impressions(s) / ED Diagnoses   Final diagnoses:  Vomiting without nausea, intractability of vomiting not specified, unspecified vomiting type    ED Discharge Orders        Ordered    ondansetron (ZOFRAN ODT) 4 MG disintegrating tablet  Every 8 hours PRN     03/30/17 1639    An After Visit Summary was printed and given to the patient.   Elson AreasSofia, Leslie K, [place]PA-C 03/30/17 1924    Marily MemosMesner, Jason, MD 03/31/17 [phone]

## 2017-03-30 NOTE — ED Notes (Signed)
Patient given oral fluids at this time. 

## 2017-03-30 NOTE — Discharge Instructions (Signed)
Return if any problems.

## 2017-04-01 ENCOUNTER — Encounter (HOSPITAL_COMMUNITY): Payer: Self-pay | Admitting: *Deleted

## 2017-04-01 ENCOUNTER — Other Ambulatory Visit: Payer: Self-pay

## 2017-04-01 ENCOUNTER — Emergency Department (HOSPITAL_COMMUNITY)
Admission: EM | Admit: 2017-04-01 | Discharge: 2017-04-01 | Discharge status: Against Medical Advice | Payer: Self-pay | Attending: Emergency Medicine | Admitting: Emergency Medicine

## 2017-04-01 DIAGNOSIS — Z5321 Procedure and treatment not carried out due to patient leaving prior to being seen by health care provider: Secondary | ICD-10-CM | POA: Insufficient documentation

## 2017-04-01 DIAGNOSIS — R112 Nausea with vomiting, unspecified: Secondary | ICD-10-CM | POA: Insufficient documentation

## 2017-04-01 LAB — CBC
HCT: 39.9 % (ref 36.0–46.0)
HEMOGLOBIN: 13.1 g/dL (ref 12.0–15.0)
MCH: 28.1 pg (ref 26.0–34.0)
MCHC: 32.8 g/dL (ref 30.0–36.0)
MCV: 85.4 fL (ref 78.0–100.0)
Platelets: 199 10*3/uL (ref 150–400)
RBC: 4.67 MIL/uL (ref 3.87–5.11)
RDW: 13.9 % (ref 11.5–15.5)
WBC: 4.5 10*3/uL (ref 4.0–10.5)

## 2017-04-01 LAB — COMPREHENSIVE METABOLIC PANEL
ALK PHOS: 37 U/L — AB (ref 38–126)
ALT: 14 U/L (ref 14–54)
ANION GAP: 7 (ref 5–15)
AST: 23 U/L (ref 15–41)
Albumin: 4.7 g/dL (ref 3.5–5.0)
BILIRUBIN TOTAL: 0.8 mg/dL (ref 0.3–1.2)
BUN: 8 mg/dL (ref 6–20)
CALCIUM: 9.3 mg/dL (ref 8.9–10.3)
CO2: 22 mmol/L (ref 22–32)
Chloride: 109 mmol/L (ref 101–111)
Creatinine, Ser: 0.59 mg/dL (ref 0.44–1.00)
GFR calc non Af Amer: >60 mL/min (ref >60)
Glucose, Bld: 101 mg/dL — ABNORMAL HIGH (ref 65–99)
POTASSIUM: 3.4 mmol/L — AB (ref 3.5–5.1)
SODIUM: 138 mmol/L (ref 135–145)
Total Protein: 7.6 g/dL (ref 6.5–8.1)

## 2017-04-01 LAB — I-STAT BETA HCG BLOOD, ED (MC, WL, AP ONLY)

## 2017-04-01 LAB — LIPASE, BLOOD: Lipase: 25 U/L (ref 11–51)

## 2017-04-01 MED ORDER — ONDANSETRON 4 MG PO TBDP
4.0000 mg | ORAL_TABLET | Freq: Once | ORAL | Status: AC | PRN
Start: 2017-04-01 — End: 2017-04-01
  Administered 2017-04-01: 4 mg via ORAL
  Filled 2017-04-01: qty 1

## 2017-04-01 NOTE — ED Notes (Signed)
Patient called for assigned room with no answer.  

## 2017-04-01 NOTE — ED Notes (Signed)
Patient was called for a room with no answer.

## 2017-04-01 NOTE — ED Triage Notes (Signed)
Pt bib EMS and presents with n/v/d.  Pt was seen two days ago for the same issue and was given prescriptions for medications but did not have money to get them filled.  Pt felt better yesterday but her symptoms began again this morning. Pt a/o x 4 and ambulatory at baseline.

## 2017-04-01 NOTE — ED Notes (Signed)
went to vitals and pt isn't in room

## 2017-06-05 ENCOUNTER — Encounter (HOSPITAL_COMMUNITY): Payer: Self-pay | Admitting: Emergency Medicine

## 2017-06-05 ENCOUNTER — Emergency Department (HOSPITAL_COMMUNITY)
Admission: EM | Admit: 2017-06-05 | Discharge: 2017-06-05 | Discharge status: Against Medical Advice | Payer: Self-pay | Attending: Emergency Medicine | Admitting: Emergency Medicine

## 2017-06-05 DIAGNOSIS — Z5321 Procedure and treatment not carried out due to patient leaving prior to being seen by health care provider: Secondary | ICD-10-CM | POA: Insufficient documentation

## 2017-06-05 NOTE — ED Notes (Signed)
Pt didn't answer when called to up vital signs

## 2017-06-05 NOTE — ED Notes (Signed)
Pt called for room 

## 2017-06-05 NOTE — ED Notes (Signed)
No answer when called for room 

## 2017-06-05 NOTE — ED Triage Notes (Signed)
Patient c/o "baceterial infections and need medications. Have fishy odor."

## 2017-06-22 ENCOUNTER — Encounter (HOSPITAL_COMMUNITY): Payer: Self-pay

## 2017-06-22 ENCOUNTER — Other Ambulatory Visit: Payer: Self-pay

## 2017-06-22 ENCOUNTER — Ambulatory Visit (HOSPITAL_COMMUNITY)
Admission: EM | Admit: 2017-06-22 | Discharge: 2017-06-22 | Disposition: A | Discharge status: Home / Self Care | Payer: Self-pay | Attending: Internal Medicine | Admitting: Internal Medicine

## 2017-06-22 DIAGNOSIS — M545 Low back pain: Secondary | ICD-10-CM

## 2017-06-22 DIAGNOSIS — N898 Other specified noninflammatory disorders of vagina: Secondary | ICD-10-CM

## 2017-06-22 DIAGNOSIS — N760 Acute vaginitis: Secondary | ICD-10-CM

## 2017-06-22 DIAGNOSIS — A5901 Trichomonal vulvovaginitis: Secondary | ICD-10-CM | POA: Insufficient documentation

## 2017-06-22 DIAGNOSIS — B9689 Other specified bacterial agents as the cause of diseases classified elsewhere: Secondary | ICD-10-CM | POA: Insufficient documentation

## 2017-06-22 DIAGNOSIS — Z3202 Encounter for pregnancy test, result negative: Secondary | ICD-10-CM

## 2017-06-22 LAB — POCT URINALYSIS DIP (DEVICE)
Bilirubin Urine: NEGATIVE
GLUCOSE, UA: NEGATIVE mg/dL
Ketones, ur: NEGATIVE mg/dL
Nitrite: NEGATIVE
Protein, ur: NEGATIVE mg/dL
Specific Gravity, Urine: 1.025 (ref 1.005–1.030)
UROBILINOGEN UA: 0.2 mg/dL (ref 0.0–1.0)
pH: 6.5 (ref 5.0–8.0)

## 2017-06-22 LAB — POCT PREGNANCY, URINE: PREG TEST UR: NEGATIVE

## 2017-06-22 MED ORDER — METRONIDAZOLE 500 MG PO TABS: 500.0000 mg | ORAL_TABLET | Freq: Two times a day (BID) | ORAL | 0 refills | Status: AC

## 2017-06-22 NOTE — Discharge Instructions (Signed)
Complete course of antibiotics.   Do not drink alcohol while taking. Will notify you of any positive findings from your testing and if any changes to treatment are needed.   If symptoms worsen or do not improve in the next week to return to be seen or to follow up with your PCP.

## 2017-06-22 NOTE — ED Triage Notes (Signed)
[facility], pt thinks she has a bacterial infection she complains of urinary frequency and tingling sensation during urination for a couple of weeks

## 2017-06-22 NOTE — ED Provider Notes (Signed)
[facility]    CSN: [id number] Arrival date & time: 06/22/17  1205     History   Chief Complaint Chief Complaint  Patient presents with  . Back Pain    HPI [name] is a 27 y.o. female.   [name] presents with complaints of vaginal discharge with odor which is tan in color which has been ongoing for a few weeks now. States she feels it is bacterial vaginosis which she has had multiple times in the past. States she has mild urinary frequency. She has an IUD. States she is not sexually active. No fevers. Denies pain with urination. Mild low back pain. Denies vulvar lesions/sores/open areas. Has not taken any medications for symptoms. Hs of std's, bv, candida vaginitis. States she has stopped douching, uses probiotics and has tried other lifestyle modifications to prevent bv.     ROS per HPI.      Past Medical History:  Diagnosis Date  . Anemia   . Anemia   . Chlamydia 2012    Patient Active Problem List   Diagnosis Date Noted  . Hx of cesarean section complicating pregnancy 02/19/2012  . Echogenic focus of heart of fetus affecting antepartum care of mother 02/19/2012  . Supervision of high-risk pregnancy with insufficient prenatal care 02/19/2012  . Trichomonas vaginalis (TV) infection 01/31/2012  . Candida vaginitis 01/31/2012  . Round ligament pain 01/31/2012  . Hyperemesis gravidarum 08/14/2011    Past Surgical History:  Procedure Laterality Date  . CESAREAN SECTION    . TOOTH EXTRACTION  2010    OB History    Gravida  2   Para  2   Term  2   Preterm      AB      Living  2     SAB      TAB      Ectopic      Multiple      Live Births  2            Home Medications    Prior to Admission medications   Medication Sig Start Date End Date Taking? Authorizing Provider  levonorgestrel (MIRENA) 20 MCG/24HR IUD 1 each by Intrauterine route once. Place 12/2015    [provider]  metroNIDAZOLE (FLAGYL) 500 MG tablet  Take 1 tablet (500 mg total) by mouth 2 (two) times daily for 7 days. 06/22/17 06/29/17  Georgetta Haber, NP    Family History Family History  Problem Relation Age of Onset  . Cancer Maternal Aunt   . Anesthesia problems Neg Hx   . Hypotension Neg Hx   . Malignant hyperthermia Neg Hx   . Pseudochol deficiency Neg Hx   . Other Neg Hx     Social History Social History   Tobacco Use  . Smoking status: Former Smoker    Packs/day: 1.00    Types: Cigarettes    Last attempt to quit: 07/12/2011    Years since quitting: 5.9  . Smokeless tobacco: Never Used  Substance Use Topics  . Alcohol use: No  . Drug use: Yes    Types: Marijuana     Allergies   Patient has no known allergies.   Review of Systems Review of Systems   Physical Exam Triage Vital Signs ED Triage Vitals  Enc Vitals Group     BP 06/22/17 1239 107/60     Pulse Rate 06/22/17 1239 68     Resp 06/22/17 1239 16     Temp 06/22/17  1239 98.5 F (36.9 C)     Temp Source 06/22/17 1239 Oral     SpO2 06/22/17 1239 100 %     Weight --      Height --      Head Circumference --      Peak Flow --      Pain Score 06/22/17 1240 0     Pain Loc --      Pain Edu? --      Excl. in GC? --    No data found.  Updated Vital Signs BP 107/60 (BP Location: Left Arm)   Pulse 68   Temp 98.5 F (36.9 C) (Oral)   Resp 16   SpO2 100%   Visual Acuity Right Eye Distance:   Left Eye Distance:   Bilateral Distance:    Right Eye Near:   Left Eye Near:    Bilateral Near:     Physical Exam  Constitutional: She is oriented to person, place, and time. She appears well-developed and well-nourished. No distress.  Cardiovascular: Normal rate, regular rhythm and normal heart sounds.  Pulmonary/Chest: Effort normal and breath sounds normal.  Abdominal: Soft. She exhibits no distension. There is no tenderness. There is no rigidity, no rebound, no guarding and no CVA tenderness.  Genitourinary:  Genitourinary Comments: Patient  declines gu exam at this time  Neurological: She is alert and oriented to person, place, and time.  Skin: Skin is warm and dry.     UC Treatments / Results  Labs (all labs ordered are listed, but only abnormal results are displayed) Labs Reviewed  POCT URINALYSIS DIP (DEVICE) - Abnormal; Notable for the following components:      Result Value   Hgb urine dipstick MODERATE (*)    Leukocytes, UA MODERATE (*)    All other components within normal limits  URINE CULTURE  POCT PREGNANCY, URINE  URINE CYTOLOGY ANCILLARY ONLY    EKG None Radiology No results found.  Procedures Procedures (including critical care time)  Medications Ordered in UC Medications - No data to display   Initial Impression / Assessment and Plan / UC Course  I have reviewed the triage vital signs and the nursing notes.  Pertinent labs & imaging results that were available during my care of the patient were reviewed by me and considered in my medical decision making (see chart for details).     Patient insistent that this is not urinary in nature and it is BV. Urine sent for culture at this time as she does describe mild low back pain as well as some urinary frequency. Likely BV, urine cytology pending. Will notify of any positive findings and if any changes to treatment are needed.  Flagyl initiated. Patient verbalized understanding and agreeable to plan.    Final Clinical Impressions(s) / UC Diagnoses   Final diagnoses:  Acute vaginitis    ED Discharge Orders        Ordered    metroNIDAZOLE (FLAGYL) 500 MG tablet  2 times daily     06/22/17 1302       Controlled Substance Prescriptions [place] Controlled Substance Registry consulted? Not Applicable   Georgetta HaberBurky, Natalie B, NP 06/22/17 [phone]

## 2017-06-23 LAB — URINE CULTURE: Culture: <10000 — AB

## 2017-06-23 LAB — URINE CYTOLOGY ANCILLARY ONLY
Chlamydia: NEGATIVE
Neisseria Gonorrhea: NEGATIVE
Trichomonas: POSITIVE — AB

## 2017-06-24 LAB — URINE CYTOLOGY ANCILLARY ONLY
Bacterial vaginitis: POSITIVE — AB
Candida vaginitis: NEGATIVE

## 2017-07-01 ENCOUNTER — Telehealth (HOSPITAL_COMMUNITY): Payer: Self-pay

## 2017-07-01 NOTE — Telephone Encounter (Signed)
Pt contacted regarding test results from recent visit. Education given on safe sex practices. Verbalized understanding.

## 2017-09-15 ENCOUNTER — Encounter (HOSPITAL_COMMUNITY): Payer: Self-pay | Admitting: *Deleted

## 2017-09-15 ENCOUNTER — Emergency Department (HOSPITAL_COMMUNITY)
Admission: EM | Admit: 2017-09-15 | Discharge: 2017-09-15 | Disposition: A | Discharge status: Home / Self Care | Payer: Self-pay | Attending: Emergency Medicine | Admitting: Emergency Medicine

## 2017-09-15 DIAGNOSIS — N739 Female pelvic inflammatory disease, unspecified: Secondary | ICD-10-CM | POA: Insufficient documentation

## 2017-09-15 DIAGNOSIS — Z87891 Personal history of nicotine dependence: Secondary | ICD-10-CM | POA: Insufficient documentation

## 2017-09-15 LAB — URINALYSIS, ROUTINE W REFLEX MICROSCOPIC
Bilirubin Urine: NEGATIVE
Glucose, UA: NEGATIVE mg/dL
Ketones, ur: NEGATIVE mg/dL
Leukocytes, UA: NEGATIVE
NITRITE: NEGATIVE
PROTEIN: NEGATIVE mg/dL
Specific Gravity, Urine: 1.024 (ref 1.005–1.030)
pH: 5 (ref 5.0–8.0)

## 2017-09-15 LAB — WET PREP, GENITAL
CLUE CELLS WET PREP: NONE SEEN
Sperm: NONE SEEN
Trich, Wet Prep: NONE SEEN
Yeast Wet Prep HPF POC: NONE SEEN

## 2017-09-15 LAB — PREGNANCY, URINE: PREG TEST UR: NEGATIVE

## 2017-09-15 MED ORDER — LIDOCAINE HCL 1 % IJ SOLN
INTRAMUSCULAR | Status: AC
  Filled 2017-09-15: qty 20

## 2017-09-15 MED ORDER — DOXYCYCLINE HYCLATE 100 MG PO CAPS: 100.0000 mg | ORAL_CAPSULE | Freq: Two times a day (BID) | ORAL | 0 refills | Status: DC

## 2017-09-15 MED ORDER — DOXYCYCLINE HYCLATE 100 MG PO TABS
100.0000 mg | ORAL_TABLET | Freq: Once | ORAL | Status: AC
  Administered 2017-09-15: 100 mg via ORAL
  Filled 2017-09-15: qty 1

## 2017-09-15 MED ORDER — CEFTRIAXONE SODIUM 250 MG IJ SOLR
250.0000 mg | Freq: Once | INTRAMUSCULAR | Status: AC
  Administered 2017-09-15: 250 mg via INTRAMUSCULAR
  Filled 2017-09-15: qty 250

## 2017-09-15 NOTE — ED Triage Notes (Signed)
Pt was on medication for bacterial vaginosis at the end of March, states she missed a dose and is concerned the medication did not cure it. Pt states she has increased urge to urinate and vaginal discharge.

## 2017-09-15 NOTE — ED Provider Notes (Signed)
[facility]EMERGENCY DEPT Provider Note   CSN: [id number] Arrival date & time: 09/15/17  0815     History   Chief Complaint Chief Complaint  Patient presents with  . Vaginal Discharge    HPI Chirstine Williamson is a 27 y.o. female.  HPI 27 year old female presents to the emergency department complaints of ongoing vaginal discharge for several months.  She states that she was seen and started on medication but she never had the medication filled.  She continues to have dark vaginal discharge.  No fevers or chills.  No significant lower abdominal pain.  Some urinary urgency.  No low back pain.  No other complaints.   Past Medical History:  Diagnosis Date  . Anemia   . Anemia   . Chlamydia 2012    Patient Active Problem List   Diagnosis Date Noted  . Hx of cesarean section complicating pregnancy 02/19/2012  . Echogenic focus of heart of fetus affecting antepartum care of mother 02/19/2012  . Supervision of high-risk pregnancy with insufficient prenatal care 02/19/2012  . Trichomonas vaginalis (TV) infection 01/31/2012  . Candida vaginitis 01/31/2012  . Round ligament pain 01/31/2012  . Hyperemesis gravidarum 08/14/2011    Past Surgical History:  Procedure Laterality Date  . CESAREAN SECTION    . TOOTH EXTRACTION  2010     OB History    Gravida  2   Para  2   Term  2   Preterm      AB      Living  2     SAB      TAB      Ectopic      Multiple      Live Births  2            Home Medications    Prior to Admission medications   Medication Sig Start Date End Date Taking? Authorizing Provider  doxycycline (VIBRAMYCIN) 100 MG capsule Take 1 capsule (100 mg total) by mouth 2 (two) times daily. 09/15/17   Azalia Bilis, MD  levonorgestrel (MIRENA) 20 MCG/24HR IUD 1 each by Intrauterine route once. Place 12/2015    [provider]    Family History Family History  Problem Relation Age of Onset  . Cancer Maternal Aunt     . Anesthesia problems Neg Hx   . Hypotension Neg Hx   . Malignant hyperthermia Neg Hx   . Pseudochol deficiency Neg Hx   . Other Neg Hx     Social History Social History   Tobacco Use  . Smoking status: Former Smoker    Packs/day: 1.00    Types: Cigarettes    Last attempt to quit: 07/12/2011    Years since quitting: 6.1  . Smokeless tobacco: Never Used  Substance Use Topics  . Alcohol use: No  . Drug use: Yes    Types: Marijuana     Allergies   Patient has no known allergies.   Review of Systems Review of Systems  All other systems reviewed and are negative.    Physical Exam Updated Vital Signs BP 100/79 (BP Location: Left Arm)   Pulse 67   Temp 98 F (36.7 C) (Oral)   Resp 18   SpO2 99%   Physical Exam  Constitutional: She is oriented to person, place, and time. She appears well-developed and well-nourished.  HENT:  Head: Normocephalic.  Eyes: EOM are normal.  Neck: Normal range of motion.  Pulmonary/Chest: Effort normal.  Abdominal: She exhibits no distension.  Genitourinary:  Genitourinary Comments: Thick brown discharge.  Mild cervical motion tenderness.  Chaperone present.  Musculoskeletal: Normal range of motion.  Neurological: She is alert and oriented to person, place, and time.  Psychiatric: She has a normal mood and affect.  Nursing note and vitals reviewed.    ED Treatments / Results  Labs (all labs ordered are listed, but only abnormal results are displayed) Labs Reviewed  WET PREP, GENITAL - Abnormal; Notable for the following components:      Result Value   WBC, Wet Prep HPF POC MANY (*)    All other components within normal limits  URINALYSIS, ROUTINE W REFLEX MICROSCOPIC - Abnormal; Notable for the following components:   APPearance HAZY (*)    Hgb urine dipstick MODERATE (*)    Bacteria, UA RARE (*)    All other components within normal limits  PREGNANCY, URINE  GC/CHLAMYDIA PROBE AMP ([place]) [facility]     EKG None  Radiology No results found.  Procedures Procedures (including critical care time)  Medications Ordered in ED Medications  lidocaine (XYLOCAINE) 1 % (with pres) injection (has no administration in time range)  cefTRIAXone (ROCEPHIN) injection 250 mg (250 mg Intramuscular Given 09/15/17 1006)  doxycycline (VIBRA-TABS) tablet 100 mg (100 mg Oral Given 09/15/17 1006)     Initial Impression / Assessment and Plan / ED Course  I have reviewed the triage vital signs and the nursing notes.  Pertinent labs & imaging results that were available during my care of the patient were reviewed by me and considered in my medical decision making (see chart for details).     Patient be treated for pelvic inflammatory disease.  Rocephin and doxy.  Home with women's outpatient follow-up.  Nontoxic.  Vital signs stable.  No indication for advanced imaging  Final Clinical Impressions(s) / ED Diagnoses   Final diagnoses:  Pelvic inflammatory disease (PID)    ED Discharge Orders        Ordered    doxycycline (VIBRAMYCIN) 100 MG capsule  2 times daily     09/15/17 1008       Azalia Bilisampos, Dagon Budai, MD 09/15/17 1015

## 2017-09-16 LAB — GC/CHLAMYDIA PROBE AMP (~~LOC~~) NOT AT ARMC
Chlamydia: NEGATIVE
Neisseria Gonorrhea: NEGATIVE

## 2017-11-24 ENCOUNTER — Ambulatory Visit (HOSPITAL_COMMUNITY)
Admission: EM | Admit: 2017-11-24 | Discharge: 2017-11-24 | Disposition: A | Discharge status: Home / Self Care | Payer: Self-pay | Attending: Family Medicine | Admitting: Family Medicine

## 2017-11-24 ENCOUNTER — Other Ambulatory Visit: Payer: Self-pay

## 2017-11-24 ENCOUNTER — Encounter (HOSPITAL_COMMUNITY): Payer: Self-pay | Admitting: Emergency Medicine

## 2017-11-24 DIAGNOSIS — Z79899 Other long term (current) drug therapy: Secondary | ICD-10-CM | POA: Insufficient documentation

## 2017-11-24 DIAGNOSIS — N898 Other specified noninflammatory disorders of vagina: Secondary | ICD-10-CM

## 2017-11-24 DIAGNOSIS — Z87891 Personal history of nicotine dependence: Secondary | ICD-10-CM | POA: Insufficient documentation

## 2017-11-24 DIAGNOSIS — Z113 Encounter for screening for infections with a predominantly sexual mode of transmission: Secondary | ICD-10-CM

## 2017-11-24 DIAGNOSIS — Z3202 Encounter for pregnancy test, result negative: Secondary | ICD-10-CM

## 2017-11-24 DIAGNOSIS — Z202 Contact with and (suspected) exposure to infections with a predominantly sexual mode of transmission: Secondary | ICD-10-CM | POA: Insufficient documentation

## 2017-11-24 LAB — POCT URINALYSIS DIP (DEVICE)
BILIRUBIN URINE: NEGATIVE
GLUCOSE, UA: NEGATIVE mg/dL
Ketones, ur: NEGATIVE mg/dL
LEUKOCYTES UA: NEGATIVE
NITRITE: NEGATIVE
Protein, ur: 30 mg/dL — AB
Specific Gravity, Urine: 1.02 (ref 1.005–1.030)
UROBILINOGEN UA: 0.2 mg/dL (ref 0.0–1.0)
pH: 7 (ref 5.0–8.0)

## 2017-11-24 LAB — POCT PREGNANCY, URINE: PREG TEST UR: NEGATIVE

## 2017-11-24 MED ORDER — METRONIDAZOLE 500 MG PO TABS
ORAL_TABLET | ORAL | Status: AC
  Filled 2017-11-24: qty 4

## 2017-11-24 MED ORDER — METRONIDAZOLE 500 MG PO TABS
2000.0000 mg | ORAL_TABLET | Freq: Once | ORAL | Status: AC
  Administered 2017-11-24: 2000 mg via ORAL

## 2017-11-24 NOTE — ED Triage Notes (Signed)
Complains of back pain.  Patient reports vaginal discharge.  Patient says she has been treated for trich, partner said they were treated, but patient does not think they were.

## 2017-11-24 NOTE — ED Provider Notes (Signed)
[facility]    CSN: [id number] Arrival date & time: 11/24/17  1220     History   Chief Complaint Chief Complaint  Patient presents with  . Back Pain    HPI [name] is a 27 y.o. female.   Patient is a 27 year old female that presents with vaginal odor, itching, discharge.  This is been for about 3 weeks.  She reports being diagnosed with trichomonas a few months ago and was treated.  When she was treated the symptoms went away.  She reports the partner that she was with was supposed to get treated but she is unsure if he ever did.  She had sexual intercourse with him again and now she is experiencing the same symptoms that she had when she was positive for trichomonas.  She reports some dysuria without hematuria.  She denies any vaginal bleeding.  She denies any abdominal pain, pelvic pain or back pain.  ROS per HPI      Past Medical History:  Diagnosis Date  . Anemia   . Anemia   . Chlamydia 2012    Patient Active Problem List   Diagnosis Date Noted  . Hx of cesarean section complicating pregnancy 02/19/2012  . Echogenic focus of heart of fetus affecting antepartum care of mother 02/19/2012  . Supervision of high-risk pregnancy with insufficient prenatal care 02/19/2012  . Trichomonas vaginalis (TV) infection 01/31/2012  . Candida vaginitis 01/31/2012  . Round ligament pain 01/31/2012  . Hyperemesis gravidarum 08/14/2011    Past Surgical History:  Procedure Laterality Date  . CESAREAN SECTION    . TOOTH EXTRACTION  2010    OB History    Gravida  2   Para  2   Term  2   Preterm      AB      Living  2     SAB      TAB      Ectopic      Multiple      Live Births  2            Home Medications    Prior to Admission medications   Medication Sig Start Date End Date Taking? Authorizing Provider  doxycycline (VIBRAMYCIN) 100 MG capsule Take 1 capsule (100 mg total) by mouth 2 (two) times daily. 09/15/17   Azalia Bilisampos, Kevin, MD    levonorgestrel (MIRENA) 20 MCG/24HR IUD 1 each by Intrauterine route once. Place 12/2015    [provider]    Family History Family History  Problem Relation Age of Onset  . Cancer Maternal Aunt   . Anesthesia problems Neg Hx   . Hypotension Neg Hx   . Malignant hyperthermia Neg Hx   . Pseudochol deficiency Neg Hx   . Other Neg Hx     Social History Social History   Tobacco Use  . Smoking status: Former Smoker    Packs/day: 1.00    Types: Cigarettes    Last attempt to quit: 07/12/2011    Years since quitting: 6.3  . Smokeless tobacco: Never Used  Substance Use Topics  . Alcohol use: No  . Drug use: Yes    Types: Marijuana     Allergies   Patient has no known allergies.   Review of Systems Review of Systems   Physical Exam Triage Vital Signs ED Triage Vitals  Enc Vitals Group     BP 11/24/17 1314 109/71     Pulse Rate 11/24/17 1314 67  Resp 11/24/17 1314 16     Temp 11/24/17 1314 98.2 F (36.8 C)     Temp Source 11/24/17 1314 Oral     SpO2 11/24/17 1314 97 %     Weight --      Height --      Head Circumference --      Peak Flow --      Pain Score 11/24/17 1316 4     Pain Loc --      Pain Edu? --      Excl. in GC? --    No data found.  Updated Vital Signs BP 109/71 (BP Location: Left Arm)   Pulse 67   Temp 98.2 F (36.8 C) (Oral)   Resp 16   SpO2 97%   Visual Acuity Right Eye Distance:   Left Eye Distance:   Bilateral Distance:    Right Eye Near:   Left Eye Near:    Bilateral Near:     Physical Exam  Constitutional: She is oriented to person, place, and time. She appears well-developed and well-nourished.  Pleasant.  Nontoxic or ill-appearing  HENT:  Head: Normocephalic and atraumatic.  Eyes: Conjunctivae are normal.  Neck: Normal range of motion.  Pulmonary/Chest: Effort normal.  Abdominal: Soft. Bowel sounds are normal. She exhibits no distension and no mass. There is no tenderness. There is no rebound and no  guarding. No hernia.  Genitourinary:  Genitourinary Comments: Deferred. Self swab obtained   Neurological: She is alert and oriented to person, place, and time.  Skin: Skin is warm.  Psychiatric: She has a normal mood and affect.  Nursing note and vitals reviewed.    UC Treatments / Results  Labs (all labs ordered are listed, but only abnormal results are displayed) Labs Reviewed  POCT URINALYSIS DIP (DEVICE) - Abnormal; Notable for the following components:      Result Value   Hgb urine dipstick TRACE (*)    Protein, ur 30 (*)    All other components within normal limits  POCT PREGNANCY, URINE  CERVICOVAGINAL ANCILLARY ONLY    EKG None  Radiology No results found.  Procedures Procedures (including critical care time)  Medications Ordered in UC Medications  metroNIDAZOLE (FLAGYL) tablet 2,000 mg (2,000 mg Oral Given 11/24/17 1348)    Initial Impression / Assessment and Plan / UC Course  I have reviewed the triage vital signs and the nursing notes.  Pertinent labs & imaging results that were available during my care of the patient were reviewed by me and considered in my medical decision making (see chart for details).     Urine negative for pregnancy or infection. We will go ahead and treat for trichomonas based on symptoms and past history.  Lab results pending. Follow up as needed for continued or worsening symptoms  Final Clinical Impressions(s) / UC Diagnoses   Final diagnoses:  STD exposure     Discharge Instructions     It was nice meeting you!!  We treated you for trichomoniasis.  Please refrain from sexual activity for atleast 7 days.  Your was negative for infection and pregnancy. Lab results pending.  The number for IT is [phone]. You can call them with assistance for the my chart.     ED Prescriptions    None     Controlled Substance Prescriptions [place] Controlled Substance Registry consulted? Not Applicable   Janace Aris,  NP 11/24/17 1528

## 2017-11-24 NOTE — Discharge Instructions (Signed)
It was nice meeting you!!  We treated you for trichomoniasis.  Please refrain from sexual activity for atleast 7 days.  Your was negative for infection and pregnancy. Lab results pending.  The number for IT is [phone]. You can call them with assistance for the my chart.

## 2017-11-25 LAB — CERVICOVAGINAL ANCILLARY ONLY
BACTERIAL VAGINITIS: POSITIVE — AB
CANDIDA VAGINITIS: NEGATIVE
Chlamydia: POSITIVE — AB
Neisseria Gonorrhea: NEGATIVE
Trichomonas: NEGATIVE

## 2017-11-27 ENCOUNTER — Telehealth: Payer: Self-pay

## 2017-11-27 MED ORDER — AZITHROMYCIN 250 MG PO TABS: 1000.0000 mg | ORAL_TABLET | Freq: Once | ORAL | 0 refills | Status: AC

## 2017-11-27 NOTE — Telephone Encounter (Signed)
Bacterial Vaginosis test is positive.  Prescription for metronidazole was given at the urgent care visit for Trichomonas. This may not completely treat and will need follow up for persistent symptoms.  Chlamydia is positive.  Rx po zithromax 1g #1 dose no refills was sent to the pharmacy of record.  Need to educate to please refrain from sexual intercourse for 7 days to give the medicine time to work, sexual partners need to be notified and tested/treated.  Condoms may reduce risk of reinfection.  Recheck or followup with PCP for further evaluation if symptoms are not improving.   [facility] notified

## 2017-12-01 ENCOUNTER — Telehealth (HOSPITAL_COMMUNITY): Payer: Self-pay

## 2017-12-01 NOTE — Telephone Encounter (Signed)
Attempted to reach patient x 2 

## 2017-12-02 ENCOUNTER — Telehealth (HOSPITAL_COMMUNITY): Payer: Self-pay

## 2017-12-02 MED ORDER — AZITHROMYCIN 250 MG PO TABS: 1000.0000 mg | ORAL_TABLET | Freq: Once | ORAL | 0 refills | Status: AC

## 2017-12-02 NOTE — Telephone Encounter (Signed)
Attempted to reach patient x 3. Letter sent to patient.

## 2017-12-05 ENCOUNTER — Telehealth: Payer: Self-pay

## 2017-12-11 ENCOUNTER — Telehealth (HOSPITAL_COMMUNITY): Payer: Self-pay

## 2017-12-11 MED ORDER — AZITHROMYCIN 250 MG PO TABS: 250.0000 mg | ORAL_TABLET | Freq: Every day | ORAL | 0 refills | Status: DC

## 2018-03-18 ENCOUNTER — Emergency Department (HOSPITAL_COMMUNITY): Payer: Self-pay

## 2018-03-18 ENCOUNTER — Encounter (HOSPITAL_COMMUNITY): Payer: Self-pay

## 2018-03-18 ENCOUNTER — Other Ambulatory Visit: Payer: Self-pay

## 2018-03-18 ENCOUNTER — Emergency Department (HOSPITAL_COMMUNITY)
Admission: EM | Admit: 2018-03-18 | Discharge: 2018-03-18 | Disposition: A | Discharge status: Home / Self Care | Payer: Self-pay | Attending: Emergency Medicine | Admitting: Emergency Medicine

## 2018-03-18 DIAGNOSIS — B9689 Other specified bacterial agents as the cause of diseases classified elsewhere: Secondary | ICD-10-CM | POA: Insufficient documentation

## 2018-03-18 DIAGNOSIS — F1290 Cannabis use, unspecified, uncomplicated: Secondary | ICD-10-CM | POA: Insufficient documentation

## 2018-03-18 DIAGNOSIS — R51 Headache: Secondary | ICD-10-CM | POA: Insufficient documentation

## 2018-03-18 DIAGNOSIS — R112 Nausea with vomiting, unspecified: Secondary | ICD-10-CM | POA: Insufficient documentation

## 2018-03-18 DIAGNOSIS — M791 Myalgia, unspecified site: Secondary | ICD-10-CM

## 2018-03-18 DIAGNOSIS — N760 Acute vaginitis: Secondary | ICD-10-CM | POA: Insufficient documentation

## 2018-03-18 DIAGNOSIS — Z87891 Personal history of nicotine dependence: Secondary | ICD-10-CM | POA: Insufficient documentation

## 2018-03-18 DIAGNOSIS — R05 Cough: Secondary | ICD-10-CM | POA: Insufficient documentation

## 2018-03-18 LAB — WET PREP, GENITAL
Sperm: NONE SEEN
Trich, Wet Prep: NONE SEEN
Yeast Wet Prep HPF POC: NONE SEEN

## 2018-03-18 LAB — URINALYSIS, ROUTINE W REFLEX MICROSCOPIC
Bilirubin Urine: NEGATIVE
Glucose, UA: NEGATIVE mg/dL
Hgb urine dipstick: NEGATIVE
Ketones, ur: NEGATIVE mg/dL
Leukocytes, UA: NEGATIVE
Nitrite: NEGATIVE
PROTEIN: 100 mg/dL — AB
Specific Gravity, Urine: 1.02 (ref 1.005–1.030)
pH: 9 — ABNORMAL HIGH (ref 5.0–8.0)

## 2018-03-18 LAB — COMPREHENSIVE METABOLIC PANEL
ALBUMIN: 4.7 g/dL (ref 3.5–5.0)
ALT: 13 U/L (ref 0–44)
AST: 21 U/L (ref 15–41)
Alkaline Phosphatase: 40 U/L (ref 38–126)
Anion gap: 11 (ref 5–15)
BUN: 8 mg/dL (ref 6–20)
CHLORIDE: 109 mmol/L (ref 98–111)
CO2: 20 mmol/L — ABNORMAL LOW (ref 22–32)
Calcium: 9.5 mg/dL (ref 8.9–10.3)
Creatinine, Ser: 0.61 mg/dL (ref 0.44–1.00)
GFR calc Af Amer: >60 mL/min (ref >60)
GFR calc non Af Amer: >60 mL/min (ref >60)
Glucose, Bld: 103 mg/dL — ABNORMAL HIGH (ref 70–99)
Potassium: 3.5 mmol/L (ref 3.5–5.1)
Sodium: 140 mmol/L (ref 135–145)
Total Bilirubin: 0.6 mg/dL (ref 0.3–1.2)
Total Protein: 7.7 g/dL (ref 6.5–8.1)

## 2018-03-18 LAB — CBC
HEMATOCRIT: 42.7 % (ref 36.0–46.0)
Hemoglobin: 13.4 g/dL (ref 12.0–15.0)
MCH: 28 pg (ref 26.0–34.0)
MCHC: 31.4 g/dL (ref 30.0–36.0)
MCV: 89.1 fL (ref 80.0–100.0)
Platelets: 215 10*3/uL (ref 150–400)
RBC: 4.79 MIL/uL (ref 3.87–5.11)
RDW: 13.5 % (ref 11.5–15.5)
WBC: 4.7 10*3/uL (ref 4.0–10.5)
nRBC: 0 % (ref 0.0–0.2)

## 2018-03-18 LAB — I-STAT BETA HCG BLOOD, ED (MC, WL, AP ONLY): I-stat hCG, quantitative: <5 m[IU]/mL (ref <5)

## 2018-03-18 LAB — LIPASE, BLOOD: Lipase: 26 U/L (ref 11–51)

## 2018-03-18 MED ORDER — METRONIDAZOLE 500 MG PO TABS: 500.0000 mg | ORAL_TABLET | Freq: Two times a day (BID) | ORAL | 0 refills | Status: DC

## 2018-03-18 MED ORDER — SODIUM CHLORIDE 0.9 % IV BOLUS
500.0000 mL | Freq: Once | INTRAVENOUS | Status: AC
  Administered 2018-03-18: 500 mL via INTRAVENOUS

## 2018-03-18 MED ORDER — ONDANSETRON 4 MG PO TBDP: 4.0000 mg | ORAL_TABLET | Freq: Three times a day (TID) | ORAL | 0 refills | Status: DC | PRN

## 2018-03-18 MED ORDER — ONDANSETRON HCL 4 MG/2ML IJ SOLN
4.0000 mg | Freq: Once | INTRAMUSCULAR | Status: AC
  Administered 2018-03-18: 4 mg via INTRAVENOUS
  Filled 2018-03-18: qty 2

## 2018-03-18 NOTE — ED Notes (Signed)
Patient given coke. 

## 2018-03-18 NOTE — ED Notes (Signed)
Urine culture sent down to lab with urinalysis. 

## 2018-03-18 NOTE — ED Triage Notes (Addendum)
Pt BIBA from home. Pt has been c/o of symptoms since Saturday night. Pt has had headache and N/V.  Denies diarrhea. Pt also endorses productive cough. Pt also adds on that she would like to be tested for STDs. Pt states that she has been having abnormal discharge.

## 2018-03-18 NOTE — Discharge Instructions (Signed)
Please read and follow all provided instructions.  Your diagnoses today include:  1. Bacterial vaginosis   2. Non-intractable vomiting with nausea, unspecified vomiting type   3. Myalgia     Tests performed today include:  Blood counts and electrolytes  Blood tests to check liver and kidney function  Blood tests to check pancreas function  Urine test to look for infection and pregnancy (in women)  Wet prep - suggests bacterial vagionosis  STD testing - pending  Vital signs. See below for your results today.   Medications prescribed:   Zofran (ondansetron) - for nausea and vomiting   Metronidazole - antibiotic  You have been prescribed an antibiotic medicine: take the entire course of medicine even if you are feeling better. Stopping early can cause the antibiotic not to work. Do not drink alcohol when taking this medication.   Take any prescribed medications only as directed.  Home care instructions:   Follow any educational materials contained in this packet.  Follow-up instructions: Please follow-up with your primary care provider in the next 3 days for further evaluation of your symptoms.    Return instructions:  SEEK IMMEDIATE MEDICAL ATTENTION IF:  The pain does not go away or becomes severe   A temperature above 101F develops   Repeated vomiting occurs (multiple episodes)   The pain becomes localized to portions of the abdomen. The right side could possibly be appendicitis. In an adult, the left lower portion of the abdomen could be colitis or diverticulitis.   Blood is being passed in stools or vomit (bright red or black tarry stools)   You develop chest pain, difficulty breathing, dizziness or fainting, or become confused, poorly responsive, or inconsolable (young children)  If you have any other emergent concerns regarding your health  Additional Information: Abdominal (belly) pain can be caused by many things. Your caregiver performed an  examination and possibly ordered blood/urine tests and imaging (CT scan, x-rays, ultrasound). Many cases can be observed and treated at home after initial evaluation in the emergency department. Even though you are being discharged home, abdominal pain can be unpredictable. Therefore, you need a repeated exam if your pain does not resolve, returns, or worsens. Most patients with abdominal pain don't have to be admitted to the hospital or have surgery, but serious problems like appendicitis and gallbladder attacks can start out as nonspecific pain. Many abdominal conditions cannot be diagnosed in one visit, so follow-up evaluations are very important.  Your vital signs today were: BP 90/62 (BP Location: Right Arm)    Pulse 75    Temp (!) 97.4 F (36.3 C) (Oral)    Resp 16    Ht 5\' 7"  (1.702 m)    Wt 49.9 kg    LMP 02/16/2018    SpO2 96%    BMI 17.23 kg/m  If your blood pressure (bp) was elevated above 135/85 this visit, please have this repeated by your doctor within one month. --------------

## 2018-03-18 NOTE — ED Provider Notes (Signed)
[facility]-EMERGENCY DEPT Provider Note   CSN: [id number] Arrival date & time: 03/18/18  1120     History   Chief Complaint Chief Complaint  Patient presents with  . Headache  . Emesis  . Exposure to STD    HPI [name] is a 27 y.o. female.  Patient presents to the emergency department with complaint of body aches, cough, abdominal pain, vomiting, and subjective fevers for the past 4 days.  Previous surgical history of cesarean section.  No chest pain or shortness of breath.  No urine symptoms.  Patient has had an abnormal brown vaginal discharge and she is requesting testing and treatment for STI.  Headache is frontal.  No head injuries reported.  No weakness, numbness, or tingling in the arms or the legs.  She has not had similar symptoms in the past.  No treatments prior to arrival.  The onset of this condition was acute. The course is constant. Aggravating factors: none. Alleviating factors: none.       Past Medical History:  Diagnosis Date  . Anemia   . Anemia   . Chlamydia 2012    Patient Active Problem List   Diagnosis Date Noted  . Hx of cesarean section complicating pregnancy 02/19/2012  . Echogenic focus of heart of fetus affecting antepartum care of mother 02/19/2012  . Supervision of high-risk pregnancy with insufficient prenatal care 02/19/2012  . Trichomonas vaginalis (TV) infection 01/31/2012  . Candida vaginitis 01/31/2012  . Round ligament pain 01/31/2012  . Hyperemesis gravidarum 08/14/2011    Past Surgical History:  Procedure Laterality Date  . CESAREAN SECTION    . TOOTH EXTRACTION  2010     OB History    Gravida  2   Para  2   Term  2   Preterm      AB      Living  2     SAB      TAB      Ectopic      Multiple      Live Births  2            Home Medications    Prior to Admission medications   Medication Sig Start Date End Date Taking? Authorizing Provider  levonorgestrel (MIRENA) 20  MCG/24HR IUD 1 each by Intrauterine route once. Place 12/2015   Yes [provider]  azithromycin (ZITHROMAX) 250 MG tablet Take 1 tablet (250 mg total) by mouth daily. Take first 2 tablets together, then 1 every day until finished. Patient not taking: Reported on 03/18/2018 12/11/17   Eustace Moore, MD  doxycycline (VIBRAMYCIN) 100 MG capsule Take 1 capsule (100 mg total) by mouth 2 (two) times daily. Patient not taking: Reported on 03/18/2018 09/15/17   Azalia Bilis, MD    Family History Family History  Problem Relation Age of Onset  . Cancer Maternal Aunt   . Anesthesia problems Neg Hx   . Hypotension Neg Hx   . Malignant hyperthermia Neg Hx   . Pseudochol deficiency Neg Hx   . Other Neg Hx     Social History Social History   Tobacco Use  . Smoking status: Former Smoker    Packs/day: 1.00    Types: Cigarettes    Last attempt to quit: 07/12/2011    Years since quitting: 6.6  . Smokeless tobacco: Never Used  Substance Use Topics  . Alcohol use: No  . Drug use: Yes    Types: Marijuana  Allergies   Patient has no known allergies.   Review of Systems Review of Systems  Constitutional: Positive for chills and fever.  HENT: Negative for rhinorrhea and sore throat.   Eyes: Negative for redness.  Respiratory: Positive for cough.   Cardiovascular: Negative for chest pain.  Gastrointestinal: Positive for abdominal pain, nausea and vomiting. Negative for diarrhea.  Genitourinary: Negative for dysuria.  Musculoskeletal: Positive for myalgias.  Skin: Negative for rash.  Neurological: Positive for headaches.     Physical Exam Updated Vital Signs BP 112/75 (BP Location: Right Arm)   Pulse 95   Temp (!) 97.4 F (36.3 C) (Oral)   Resp 16   Ht 5\' 7"  (1.702 m)   Wt 49.9 kg   LMP 02/16/2018   SpO2 100%   BMI 17.23 kg/m   Physical Exam Vitals signs and nursing note reviewed. Exam conducted with a chaperone present.  Constitutional:      Appearance:  She is well-developed.     Comments: Patient appears uncomfortable  HENT:     Head: Normocephalic and atraumatic.     Right Ear: Tympanic membrane, ear canal and external ear normal.     Left Ear: Tympanic membrane, ear canal and external ear normal.     Nose: Nose normal.     Mouth/Throat:     Pharynx: Uvula midline.  Eyes:     General: Lids are normal.        Right eye: No discharge.        Left eye: No discharge.     Extraocular Movements:     Right eye: No nystagmus.     Left eye: No nystagmus.     Conjunctiva/sclera: Conjunctivae normal.     Pupils: Pupils are equal, round, and reactive to light.  Neck:     Musculoskeletal: Normal range of motion and neck supple.  Cardiovascular:     Rate and Rhythm: Normal rate and regular rhythm.     Heart sounds: Normal heart sounds.  Pulmonary:     Effort: Pulmonary effort is normal.     Breath sounds: Normal breath sounds.  Abdominal:     Palpations: Abdomen is soft. There is no mass.     Tenderness: There is no abdominal tenderness. There is no guarding.     Comments: Patient with no abdominal tenderness at time of exam.  She reports that this pain is intermittent.  Genitourinary:    Exam position: Supine.     Labia:        Right: No rash, tenderness or lesion.        Left: No rash, tenderness or lesion.      Vagina: Vaginal discharge (homogenous, opaque) present. No bleeding.     Cervix: No cervical motion tenderness, discharge or erythema.     Adnexa:        Right: No tenderness.         Left: No tenderness.    Musculoskeletal:     Cervical back: She exhibits normal range of motion, no tenderness and no bony tenderness.  Skin:    General: Skin is warm and dry.  Neurological:     Mental Status: She is alert and oriented to person, place, and time.     GCS: GCS eye subscore is 4. GCS verbal subscore is 5. GCS motor subscore is 6.     Cranial Nerves: No cranial nerve deficit.     Sensory: No sensory deficit.      Coordination: Coordination normal.  Gait: Gait normal.     Deep Tendon Reflexes: Reflexes are normal and symmetric.      ED Treatments / Results  Labs (all labs ordered are listed, but only abnormal results are displayed) Labs Reviewed  WET PREP, GENITAL - Abnormal; Notable for the following components:      Result Value   Clue Cells Wet Prep HPF POC PRESENT (*)    WBC, Wet Prep HPF POC MODERATE (*)    All other components within normal limits  COMPREHENSIVE METABOLIC PANEL - Abnormal; Notable for the following components:   CO2 20 (*)    Glucose, Bld 103 (*)    All other components within normal limits  URINALYSIS, ROUTINE W REFLEX MICROSCOPIC - Abnormal; Notable for the following components:   APPearance CLOUDY (*)    pH 9.0 (*)    Protein, ur 100 (*)    Bacteria, UA MANY (*)    All other components within normal limits  LIPASE, BLOOD  CBC  RPR  HIV ANTIBODY (ROUTINE TESTING W REFLEX)  I-STAT BETA HCG BLOOD, ED ([facility], [facility], [facility] ONLY)  GC/CHLAMYDIA PROBE AMP ([place]) NOT AT [facility]    EKG None  Radiology Dg Abd Acute W/chest  Result Date: 03/18/2018 CLINICAL DATA:  Cough with vomiting and abdominal pain. EXAM: DG ABDOMEN ACUTE W/ 1V CHEST COMPARISON:  None. FINDINGS: Both lungs are clear. Heart and mediastinum are normal. Negative for a pneumothorax. Gas in the stomach. Otherwise, paucity of bowel gas throughout the abdomen. Patient has an IUD. No large abdominal calcifications. Left pelvic calcification is suggestive for a phlebolith. Difficult to exclude mild rotary scoliosis in the lumbar spine. Negative for free air. IMPRESSION: Negative abdominal radiographs.  No acute cardiopulmonary disease. IUD present. Electronically Signed   By: Richarda OverlieAdam  Henn M.D.   On: 03/18/2018 13:05    Procedures Procedures (including critical care time)  Medications Ordered in ED Medications  ondansetron (ZOFRAN) injection 4 mg (4 mg Intravenous Given 03/18/18 1215)  sodium chloride 0.9  % bolus 500 mL (0 mLs Intravenous Stopped 03/18/18 1334)  sodium chloride 0.9 % bolus 500 mL (500 mLs Intravenous New Bag/Given 03/18/18 1334)     Initial Impression / Assessment and Plan / ED Course  I have reviewed the triage vital signs and the nursing notes.  Pertinent labs & imaging results that were available during my care of the patient were reviewed by me and considered in my medical decision making (see chart for details).     Patient seen and examined. Work-up initiated. Medications ordered. Will need pelvic exam.   Vital signs reviewed and are as follows: BP 112/75 (BP Location: Right Arm)   Pulse 95   Temp (!) 97.4 F (36.3 C) (Oral)   Resp 16   Ht 5\' 7"  (1.702 m)   Wt 49.9 kg   LMP 02/16/2018   SpO2 100%   BMI 17.23 kg/m   2:43 PM Patient feels much better with treatment. She is drinking her 2nd ginger ale without N/V. Pelvic exam performed by PA student under my supervision. + discharge concerning for BV, no tenderness or signs suggestive of PID. Will start on flagyl. Will await GC/chlamydia to determine if cervicitis present.   Pt updated. Home with zofran as well.   The patient was urged to return to the Emergency Department immediately with worsening of current symptoms, worsening abdominal pain, persistent vomiting, blood noted in stools, fever, or any other concerns. The patient verbalized understanding.    Final Clinical  Impressions(s) / ED Diagnoses   Final diagnoses:  Bacterial vaginosis  Non-intractable vomiting with nausea, unspecified vomiting type  Myalgia   Patient presented with multiple complaints, mainly nausea and vomiting, abdominal pain that was not present on arrival, body aches, subjective fever.  Patient also complained of vaginal discharge.  Patient's work-up is reassuring.  Normal white blood cell count.  Liver function test and lipase are normal.  UA without signs of infection.  Pelvic exam without signs of PID.  She does have a  discharge that is suspicious for BV and clue cells on wet prep.  Cervicitis and other STI testing is pending.  Patient is feeling better with treatment with fluids and Zofran.  We will discharged home with Zofran and Flagyl.  Return instructions as above.  Patient appears well, nontoxic, no distress at time of discharge.   ED Discharge Orders         Ordered    ondansetron (ZOFRAN ODT) 4 MG disintegrating tablet  Every 8 hours PRN     03/18/18 1446    metroNIDAZOLE (FLAGYL) 500 MG tablet  2 times daily     03/18/18 1446           Renne Crigler, PA-C 03/18/18 1450    Alvira Monday, MD 03/19/18 1110

## 2018-03-18 NOTE — ED Notes (Signed)
Patient given ginger ale. 

## 2018-03-19 LAB — GC/CHLAMYDIA PROBE AMP (~~LOC~~) NOT AT ARMC
Chlamydia: NEGATIVE
Neisseria Gonorrhea: NEGATIVE

## 2018-03-19 LAB — HIV ANTIBODY (ROUTINE TESTING W REFLEX): HIV Screen 4th Generation wRfx: NONREACTIVE

## 2018-03-19 LAB — RPR: RPR Ser Ql: NONREACTIVE

## 2018-08-16 ENCOUNTER — Other Ambulatory Visit: Payer: Self-pay

## 2018-08-16 ENCOUNTER — Encounter (HOSPITAL_COMMUNITY): Payer: Self-pay | Admitting: Emergency Medicine

## 2018-08-16 ENCOUNTER — Ambulatory Visit (HOSPITAL_COMMUNITY)
Admission: EM | Admit: 2018-08-16 | Discharge: 2018-08-16 | Disposition: A | Discharge status: Home / Self Care | Payer: Self-pay | Attending: Emergency Medicine | Admitting: Emergency Medicine

## 2018-08-16 DIAGNOSIS — B9689 Other specified bacterial agents as the cause of diseases classified elsewhere: Secondary | ICD-10-CM

## 2018-08-16 DIAGNOSIS — N760 Acute vaginitis: Secondary | ICD-10-CM

## 2018-08-16 DIAGNOSIS — Z3202 Encounter for pregnancy test, result negative: Secondary | ICD-10-CM

## 2018-08-16 LAB — POCT URINALYSIS DIP (DEVICE)
Bilirubin Urine: NEGATIVE
Glucose, UA: NEGATIVE mg/dL
Ketones, ur: NEGATIVE mg/dL
Leukocytes,Ua: NEGATIVE
Nitrite: NEGATIVE
Protein, ur: NEGATIVE mg/dL
Specific Gravity, Urine: >=1.03 (ref 1.005–1.030)
Urobilinogen, UA: 0.2 mg/dL (ref 0.0–1.0)
pH: 7 (ref 5.0–8.0)

## 2018-08-16 LAB — POCT PREGNANCY, URINE: Preg Test, Ur: NEGATIVE

## 2018-08-16 MED ORDER — METRONIDAZOLE 500 MG PO TABS: 500.0000 mg | ORAL_TABLET | Freq: Two times a day (BID) | ORAL | 0 refills | Status: AC

## 2018-08-16 NOTE — ED Provider Notes (Signed)
[facility]    CSN: [id number] Arrival date & time: 08/16/18  1314     History   Chief Complaint Chief Complaint  Patient presents with  . Appointment    13:20  . Urinary Tract Infection    HPI [name] is a 28 y.o. female history of recurrent BV presenting today for evaluation of likely BV infection.  Patient states that over the past couple weeks she has developed symptoms similar to when she has had BV previously.  She states that she has had some discharge as well as associated dysuria and increased frequency.  Denies itching or irritation.  Denies fever, nausea or vomiting.  Denies abdominal pain, pelvic pain or back pain.  Denies concern for STDs.  Typically takes Flagyl without issue.  Currently spotting/ending menstrual cycle.  Uses Mirena.  Denies any rashes or lesions.  HPI  Past Medical History:  Diagnosis Date  . Anemia   . Anemia   . Chlamydia 2012    Patient Active Problem List   Diagnosis Date Noted  . Hx of cesarean section complicating pregnancy 02/19/2012  . Echogenic focus of heart of fetus affecting antepartum care of mother 02/19/2012  . Supervision of high-risk pregnancy with insufficient prenatal care 02/19/2012  . Trichomonas vaginalis (TV) infection 01/31/2012  . Candida vaginitis 01/31/2012  . Round ligament pain 01/31/2012  . Hyperemesis gravidarum 08/14/2011    Past Surgical History:  Procedure Laterality Date  . CESAREAN SECTION    . TOOTH EXTRACTION  2010    OB History    Gravida  2   Para  2   Term  2   Preterm      AB      Living  2     SAB      TAB      Ectopic      Multiple      Live Births  2            Home Medications    Prior to Admission medications   Medication Sig Start Date End Date Taking? Authorizing Provider  levonorgestrel (MIRENA) 20 MCG/24HR IUD 1 each by Intrauterine route once. Place 12/2015    [provider]  metroNIDAZOLE (FLAGYL) 500 MG tablet Take 1 tablet  (500 mg total) by mouth 2 (two) times daily for 7 days. 08/16/18 08/23/18  Brittanie Dosanjh C, PA-C  ondansetron (ZOFRAN ODT) 4 MG disintegrating tablet Take 1 tablet (4 mg total) by mouth every 8 (eight) hours as needed for nausea or vomiting. 03/18/18   Renne CriglerGeiple, Joshua, PA-C    Family History Family History  Problem Relation Age of Onset  . Cancer Maternal Aunt   . Anesthesia problems Neg Hx   . Hypotension Neg Hx   . Malignant hyperthermia Neg Hx   . Pseudochol deficiency Neg Hx   . Other Neg Hx     Social History Social History   Tobacco Use  . Smoking status: Former Smoker    Packs/day: 1.00    Types: Cigarettes    Last attempt to quit: 07/12/2011    Years since quitting: 7.1  . Smokeless tobacco: Never Used  Substance Use Topics  . Alcohol use: No  . Drug use: Yes    Types: Marijuana     Allergies   Patient has no known allergies.   Review of Systems Review of Systems  Constitutional: Negative for fever.  Respiratory: Negative for shortness of breath.   Cardiovascular: Negative for chest pain.  Gastrointestinal: Negative for abdominal pain, diarrhea, nausea and vomiting.  Genitourinary: Positive for dysuria, frequency and vaginal discharge. Negative for flank pain, genital sores, hematuria, menstrual problem, vaginal bleeding and vaginal pain.  Musculoskeletal: Negative for back pain.  Skin: Negative for rash.  Neurological: Negative for dizziness, light-headedness and headaches.     Physical Exam Triage Vital Signs ED Triage Vitals [08/16/18 1326]  Enc Vitals Group     BP 93/60     Pulse Rate 76     Resp 12     Temp 97.9 F (36.6 C)     Temp Source Oral     SpO2 99 %     Weight      Height      Head Circumference      Peak Flow      Pain Score      Pain Loc      Pain Edu?      Excl. in GC?    No data found.  Updated Vital Signs BP 93/60 (BP Location: Left Arm)   Pulse 76   Temp 97.9 F (36.6 C) (Oral)   Resp 12   SpO2 99%   Visual  Acuity Right Eye Distance:   Left Eye Distance:   Bilateral Distance:    Right Eye Near:   Left Eye Near:    Bilateral Near:     Physical Exam Vitals signs and nursing note reviewed.  Constitutional:      General: She is not in acute distress.    Appearance: She is well-developed.  HENT:     Head: Normocephalic and atraumatic.  Eyes:     Conjunctiva/sclera: Conjunctivae normal.  Neck:     Musculoskeletal: Neck supple.  Cardiovascular:     Rate and Rhythm: Normal rate and regular rhythm.     Heart sounds: No murmur.  Pulmonary:     Effort: Pulmonary effort is normal. No respiratory distress.     Breath sounds: Normal breath sounds.  Abdominal:     Palpations: Abdomen is soft.     Tenderness: There is no abdominal tenderness.     Comments: Soft, nondistended, nontender to light palpation throughout abdomen  Genitourinary:    Comments: Deferred Skin:    General: Skin is warm and dry.  Neurological:     Mental Status: She is alert.      UC Treatments / Results  Labs (all labs ordered are listed, but only abnormal results are displayed) Labs Reviewed  POCT URINALYSIS DIP (DEVICE) - Abnormal; Notable for the following components:      Result Value   Hgb urine dipstick TRACE (*)    All other components within normal limits  POC URINE PREG, ED  POCT PREGNANCY, URINE    EKG None  Radiology No results found.  Procedures Procedures (including critical care time)  Medications Ordered in UC Medications - No data to display  Initial Impression / Assessment and Plan / UC Course  I have reviewed the triage vital signs and the nursing notes.  Pertinent labs & imaging results that were available during my care of the patient were reviewed by me and considered in my medical decision making (see chart for details).     UA negative for leuks and nitrites, will empirically treat for BV with metronidazole twice daily x1 week.  Will defer any swab per patient request,  denies concern for STDs, history of similar.  Follow-up if symptoms not resolving or worsening, changing.   Final Clinical Impressions(s) /  UC Diagnoses   Final diagnoses:  BV (bacterial vaginosis)     Discharge Instructions     Begin taking metronidazole twice daily for the next week to treat BV.  Please not drink alcohol until 24 hours after taking the last tablet.  Please follow-up if symptoms not resolving with the above treatment.    ED Prescriptions    Medication Sig Dispense Auth. Provider   metroNIDAZOLE (FLAGYL) 500 MG tablet Take 1 tablet (500 mg total) by mouth 2 (two) times daily for 7 days. 14 tablet Delsa Walder, [place] C, PA-C     Controlled Substance Prescriptions [place] Controlled Substance Registry consulted? Not Applicable   Lew Dawes, [place] 08/16/18 1412

## 2018-08-16 NOTE — ED Triage Notes (Signed)
Seen by provider

## 2018-08-16 NOTE — Discharge Instructions (Signed)
Begin taking metronidazole twice daily for the next week to treat BV.  Please not drink alcohol until 24 hours after taking the last tablet.  Please follow-up if symptoms not resolving with the above treatment.

## 2018-10-22 ENCOUNTER — Ambulatory Visit (HOSPITAL_COMMUNITY)
Admission: EM | Admit: 2018-10-22 | Discharge: 2018-10-22 | Disposition: A | Discharge status: Home / Self Care | Payer: Self-pay | Attending: Internal Medicine | Admitting: Internal Medicine

## 2018-10-22 ENCOUNTER — Encounter (HOSPITAL_COMMUNITY): Payer: Self-pay

## 2018-10-22 ENCOUNTER — Other Ambulatory Visit: Payer: Self-pay

## 2018-10-22 DIAGNOSIS — B9689 Other specified bacterial agents as the cause of diseases classified elsewhere: Secondary | ICD-10-CM | POA: Insufficient documentation

## 2018-10-22 DIAGNOSIS — N760 Acute vaginitis: Secondary | ICD-10-CM | POA: Insufficient documentation

## 2018-10-22 LAB — POCT URINALYSIS DIP (DEVICE)
Bilirubin Urine: NEGATIVE
Glucose, UA: NEGATIVE mg/dL
Ketones, ur: NEGATIVE mg/dL
Leukocytes,Ua: NEGATIVE
Nitrite: NEGATIVE
Protein, ur: NEGATIVE mg/dL
Specific Gravity, Urine: 1.025 (ref 1.005–1.030)
Urobilinogen, UA: 0.2 mg/dL (ref 0.0–1.0)
pH: 7 (ref 5.0–8.0)

## 2018-10-22 MED ORDER — METRONIDAZOLE 500 MG PO TABS: 500.0000 mg | ORAL_TABLET | Freq: Two times a day (BID) | ORAL | 0 refills | Status: DC

## 2018-10-22 NOTE — ED Triage Notes (Signed)
Pt states she needs to be tested for STD's and she has vaginal discharge x 2 weeks. Pt states she thinks she has a UTI.

## 2018-10-22 NOTE — ED Provider Notes (Signed)
[place]    CSN: [id number] Arrival date & time: 10/22/18  1312      History   Chief Complaint Chief Complaint  Patient presents with  . SEXUALLY TRANSMITTED DISEASE    HPI [name] is a 28 y.o. female with no past medical history comes to urgent care with complaints of vaginal discharge of 2 weeks duration.  Vaginal discharge is clear with a fishy odor.  No vaginal itching.  No vaginal bleeding.  Patient is sexually active with 1 sexual partner.  No fever or chills.  Patient admits to having some dysuria without urgency or frequency.   Patient admits douching a couple of weeks or so ago.  Her last menstrual period was 2 weeks ago with normal flow and duration.  No nausea or vomiting.  No abdominal pain.  HPI  Past Medical History:  Diagnosis Date  . Anemia   . Anemia   . Chlamydia 2012    Patient Active Problem List   Diagnosis Date Noted  . Hx of cesarean section complicating pregnancy 87/68/1157  . Echogenic focus of heart of fetus affecting antepartum care of mother 02/19/2012  . Supervision of high-risk pregnancy with insufficient prenatal care 02/19/2012  . Trichomonas vaginalis (TV) infection 01/31/2012  . Candida vaginitis 01/31/2012  . Round ligament pain 01/31/2012  . Hyperemesis gravidarum 08/14/2011    Past Surgical History:  Procedure Laterality Date  . CESAREAN SECTION    . TOOTH EXTRACTION  2010    OB History    Gravida  2   Para  2   Term  2   Preterm      AB      Living  2     SAB      TAB      Ectopic      Multiple      Live Births  2            Home Medications    Prior to Admission medications   Medication Sig Start Date End Date Taking? Authorizing Provider  levonorgestrel (MIRENA) 20 MCG/24HR IUD 1 each by Intrauterine route once. Place 12/2015    [provider]  ondansetron (ZOFRAN ODT) 4 MG disintegrating tablet Take 1 tablet (4 mg total) by mouth every 8 (eight) hours as needed for  nausea or vomiting. 03/18/18   Carlisle Cater, PA-C    Family History Family History  Problem Relation Age of Onset  . Cancer Maternal Aunt   . Anesthesia problems Neg Hx   . Hypotension Neg Hx   . Malignant hyperthermia Neg Hx   . Pseudochol deficiency Neg Hx   . Other Neg Hx     Social History Social History   Tobacco Use  . Smoking status: Former Smoker    Packs/day: 1.00    Types: Cigarettes    Quit date: 07/12/2011    Years since quitting: 7.2  . Smokeless tobacco: Never Used  Substance Use Topics  . Alcohol use: No  . Drug use: Yes    Types: Marijuana     Allergies   Patient has no known allergies.   Review of Systems Review of Systems  Constitutional: Negative.   HENT: Negative.   Respiratory: Negative.   Cardiovascular: Negative.   Gastrointestinal: Positive for abdominal pain and nausea. Negative for diarrhea and vomiting.  Genitourinary: Positive for dysuria and vaginal discharge. Negative for dyspareunia, frequency, genital sores, hematuria and urgency.  Musculoskeletal: Negative for arthralgias.  Skin: Negative.  Neurological: Negative for dizziness, weakness and light-headedness.     Physical Exam Triage Vital Signs ED Triage Vitals  Enc Vitals Group     BP 10/22/18 1330 113/79     Pulse Rate 10/22/18 1330 80     Resp 10/22/18 1330 16     Temp 10/22/18 1330 98.3 F (36.8 C)     Temp Source 10/22/18 1330 Oral     SpO2 10/22/18 1330 100 %     Weight 10/22/18 1334 121 lb (54.9 kg)     Height --      Head Circumference --      Peak Flow --      Pain Score 10/22/18 1334 2     Pain Loc --      Pain Edu? --      Excl. in GC? --    No data found.  Updated Vital Signs BP 113/79 (BP Location: Right Arm)   Pulse 80   Temp 98.3 F (36.8 C) (Oral)   Resp 16   Wt 54.9 kg   SpO2 100%   BMI 18.95 kg/m   Visual Acuity Right Eye Distance:   Left Eye Distance:   Bilateral Distance:    Right Eye Near:   Left Eye Near:    Bilateral  Near:     Physical Exam Constitutional:      Appearance: She is not ill-appearing, toxic-appearing or diaphoretic.  Cardiovascular:     Rate and Rhythm: Normal rate and regular rhythm.     Pulses: Normal pulses.     Heart sounds: Normal heart sounds. No murmur. No friction rub.  Pulmonary:     Effort: Pulmonary effort is normal.     Breath sounds: Normal breath sounds.  Skin:    Capillary Refill: Capillary refill takes less than 2 seconds.  Neurological:     General: No focal deficit present.     Mental Status: She is alert and oriented to person, place, and time.      UC Treatments / Results  Labs (all labs ordered are listed, but only abnormal results are displayed) Labs Reviewed - No data to display  EKG   Radiology No results found.  Procedures Procedures (including critical care time)  Medications Ordered in UC Medications - No data to display  Initial Impression / Assessment and Plan / UC Course  I have reviewed the triage vital signs and the nursing notes.  Pertinent labs & imaging results that were available during my care of the patient were reviewed by me and considered in my medical decision making (see chart for details).     1.  Bacterial vaginosis: Metronidazole 500 mg twice daily for 7 days Cervical swab for GC/chlamydia, trichomonas, Gardnerella vaginalis We will call the patient with the result if there is significant Patient was counseled against douching. Final Clinical Impressions(s) / UC Diagnoses   Final diagnoses:  None   Discharge Instructions   None    ED Prescriptions    None     Controlled Substance Prescriptions [place] Controlled Substance Registry consulted? No   Merrilee JanskyLamptey,  O, MD 10/22/18 [phone]

## 2018-10-24 LAB — CERVICOVAGINAL ANCILLARY ONLY
Bacterial vaginitis: POSITIVE — AB
Candida vaginitis: NEGATIVE
Chlamydia: NEGATIVE
Neisseria Gonorrhea: NEGATIVE
Trichomonas: NEGATIVE

## 2018-11-24 ENCOUNTER — Encounter (HOSPITAL_COMMUNITY): Payer: Self-pay | Admitting: Emergency Medicine

## 2018-11-24 ENCOUNTER — Ambulatory Visit (HOSPITAL_COMMUNITY)
Admission: EM | Admit: 2018-11-24 | Discharge: 2018-11-24 | Disposition: A | Discharge status: Home / Self Care | Payer: Self-pay | Attending: Emergency Medicine | Admitting: Emergency Medicine

## 2018-11-24 DIAGNOSIS — Z113 Encounter for screening for infections with a predominantly sexual mode of transmission: Secondary | ICD-10-CM

## 2018-11-24 DIAGNOSIS — Z202 Contact with and (suspected) exposure to infections with a predominantly sexual mode of transmission: Secondary | ICD-10-CM

## 2018-11-24 DIAGNOSIS — N898 Other specified noninflammatory disorders of vagina: Secondary | ICD-10-CM

## 2018-11-24 DIAGNOSIS — Z3202 Encounter for pregnancy test, result negative: Secondary | ICD-10-CM

## 2018-11-24 LAB — POCT URINALYSIS DIP (DEVICE)
Bilirubin Urine: NEGATIVE
Glucose, UA: NEGATIVE mg/dL
Ketones, ur: NEGATIVE mg/dL
Nitrite: NEGATIVE
Protein, ur: NEGATIVE mg/dL
Specific Gravity, Urine: 1.025 (ref 1.005–1.030)
Urobilinogen, UA: 0.2 mg/dL (ref 0.0–1.0)
pH: 7.5 (ref 5.0–8.0)

## 2018-11-24 LAB — POCT PREGNANCY, URINE: Preg Test, Ur: NEGATIVE

## 2018-11-24 MED ORDER — CEFTRIAXONE SODIUM 250 MG IJ SOLR
INTRAMUSCULAR | Status: AC
  Filled 2018-11-24: qty 250

## 2018-11-24 MED ORDER — METRONIDAZOLE 500 MG PO TABS
2000.0000 mg | ORAL_TABLET | Freq: Once | ORAL | Status: AC
  Administered 2018-11-24: 2000 mg via ORAL

## 2018-11-24 MED ORDER — METRONIDAZOLE 500 MG PO TABS
ORAL_TABLET | ORAL | Status: AC
  Filled 2018-11-24: qty 4

## 2018-11-24 MED ORDER — AZITHROMYCIN 250 MG PO TABS
1000.0000 mg | ORAL_TABLET | Freq: Once | ORAL | Status: AC
  Administered 2018-11-24: 1000 mg via ORAL

## 2018-11-24 MED ORDER — AZITHROMYCIN 250 MG PO TABS
ORAL_TABLET | ORAL | Status: AC
  Filled 2018-11-24: qty 4

## 2018-11-24 MED ORDER — CEFTRIAXONE SODIUM 250 MG IJ SOLR
250.0000 mg | Freq: Once | INTRAMUSCULAR | Status: AC
  Administered 2018-11-24: 250 mg via INTRAMUSCULAR

## 2018-11-24 MED ORDER — LIDOCAINE HCL (PF) 1 % IJ SOLN
INTRAMUSCULAR | Status: AC
  Filled 2018-11-24: qty 2

## 2018-11-24 NOTE — ED Triage Notes (Signed)
Pt states she was treated for BV a month ago, pt states she forgot to take medicine, states she thinks she messed up how she was supposed to take it. Pt states she has the same symptoms, vaginal discharge. States she doesn't think it ever fully went away.

## 2018-11-24 NOTE — ED Provider Notes (Signed)
[place]    CSN: [id number] Arrival date & time: 11/24/18  1144      History   Chief Complaint Chief Complaint  Patient presents with  . Vaginal Discharge    HPI [name] is a 28 y.o. female.   Patient presents with tan-colored malodorous vaginal discharge, dysuria, and suprapubic abdominal pain x1 month.  She denies fever, chills, vomiting, diarrhea, pelvic pain, flank pain, or other symptoms.  She was seen here on 10/22/2018 and treated for bacterial vaginosis but states she forgot to take several doses of the medication; was positive for Gardnerella vaginalis at that time.  She request STD testing and treatment today but declines HIV or RPR.  She is sexually active and does not consistently use condoms.  LMP: 10/31/2018.  Medical history significant for bacterial vaginosis, chlamydia, trichomonas, candida.    The history is provided by the patient.    Past Medical History:  Diagnosis Date  . Anemia   . Anemia   . Chlamydia 2012    Patient Active Problem List   Diagnosis Date Noted  . Hx of cesarean section complicating pregnancy 27/74/1287  . Echogenic focus of heart of fetus affecting antepartum care of mother 02/19/2012  . Supervision of high-risk pregnancy with insufficient prenatal care 02/19/2012  . Trichomonas vaginalis (TV) infection 01/31/2012  . Candida vaginitis 01/31/2012  . Round ligament pain 01/31/2012  . Hyperemesis gravidarum 08/14/2011    Past Surgical History:  Procedure Laterality Date  . CESAREAN SECTION    . TOOTH EXTRACTION  2010    OB History    Gravida  2   Para  2   Term  2   Preterm      AB      Living  2     SAB      TAB      Ectopic      Multiple      Live Births  2            Home Medications    Prior to Admission medications   Medication Sig Start Date End Date Taking? Authorizing Provider  levonorgestrel (MIRENA) 20 MCG/24HR IUD 1 each by Intrauterine route once. Place 12/2015     [provider]  metroNIDAZOLE (FLAGYL) 500 MG tablet Take 1 tablet (500 mg total) by mouth 2 (two) times daily. 10/22/18   Chase Picket, MD  ondansetron (ZOFRAN ODT) 4 MG disintegrating tablet Take 1 tablet (4 mg total) by mouth every 8 (eight) hours as needed for nausea or vomiting. 03/18/18   Carlisle Cater, PA-C    Family History Family History  Problem Relation Age of Onset  . Cancer Maternal Aunt   . Anesthesia problems Neg Hx   . Hypotension Neg Hx   . Malignant hyperthermia Neg Hx   . Pseudochol deficiency Neg Hx   . Other Neg Hx     Social History Social History   Tobacco Use  . Smoking status: Former Smoker    Packs/day: 1.00    Types: Cigarettes    Quit date: 07/12/2011    Years since quitting: 7.3  . Smokeless tobacco: Never Used  Substance Use Topics  . Alcohol use: No  . Drug use: Yes    Types: Marijuana     Allergies   Patient has no known allergies.   Review of Systems Review of Systems  Constitutional: Negative for chills and fever.  HENT: Negative for ear pain and sore throat.  Eyes: Negative for pain and visual disturbance.  Respiratory: Negative for cough and shortness of breath.   Cardiovascular: Negative for chest pain and palpitations.  Gastrointestinal: Positive for abdominal pain. Negative for diarrhea and vomiting.  Genitourinary: Positive for dysuria and vaginal discharge. Negative for flank pain, hematuria and pelvic pain.  Musculoskeletal: Negative for arthralgias and back pain.  Skin: Negative for color change and rash.  Neurological: Negative for seizures and syncope.  All other systems reviewed and are negative.    Physical Exam Triage Vital Signs ED Triage Vitals  Enc Vitals Group     BP 11/24/18 1216 (!) 112/52     Pulse Rate 11/24/18 1216 91     Resp 11/24/18 1216 18     Temp 11/24/18 1216 98 F (36.7 C)     Temp src --      SpO2 11/24/18 1216 100 %     Weight --      Height --      Head Circumference --       Peak Flow --      Pain Score 11/24/18 1219 0     Pain Loc --      Pain Edu? --      Excl. in GC? --    No data found.  Updated Vital Signs BP (!) 112/52   Pulse 91   Temp 98 F (36.7 C)   Resp 18   SpO2 100%   Visual Acuity Right Eye Distance:   Left Eye Distance:   Bilateral Distance:    Right Eye Near:   Left Eye Near:    Bilateral Near:     Physical Exam Vitals signs and nursing note reviewed.  Constitutional:      General: She is not in acute distress.    Appearance: She is well-developed.  HENT:     Head: Normocephalic and atraumatic.     Mouth/Throat:     Mouth: Mucous membranes are moist.     Pharynx: Oropharynx is clear.  Eyes:     Conjunctiva/sclera: Conjunctivae normal.  Neck:     Musculoskeletal: Neck supple.  Cardiovascular:     Rate and Rhythm: Normal rate and regular rhythm.     Heart sounds: No murmur.  Pulmonary:     Effort: Pulmonary effort is normal. No respiratory distress.     Breath sounds: Normal breath sounds.  Abdominal:     General: Bowel sounds are normal.     Palpations: Abdomen is soft.     Tenderness: There is no abdominal tenderness. There is no right CVA tenderness, left CVA tenderness, guarding or rebound.  Skin:    General: Skin is warm and dry.     Findings: No rash.  Neurological:     General: No focal deficit present.     Mental Status: She is alert and oriented to person, place, and time.      UC Treatments / Results  Labs (all labs ordered are listed, but only abnormal results are displayed) Labs Reviewed  URINE CULTURE  POC URINE PREG, ED  POCT PREGNANCY, URINE  CERVICOVAGINAL ANCILLARY ONLY    EKG   Radiology No results found.  Procedures Procedures (including critical care time)  Medications Ordered in UC Medications  metroNIDAZOLE (FLAGYL) tablet 2,000 mg (has no administration in time range)  azithromycin (ZITHROMAX) tablet 1,000 mg (has no administration in time range)  cefTRIAXone  (ROCEPHIN) injection 250 mg (has no administration in time range)    Initial Impression /  Assessment and Plan / UC Course  I have reviewed the triage vital signs and the nursing notes.  Pertinent labs & imaging results that were available during my care of the patient were reviewed by me and considered in my medical decision making (see chart for details).   Genital discharge, possible exposure to STD.  Urine pregnancy negative.  Urine dip shows trace LE and trace blood; culture pending.  Treated here with Rocephin, Zithromax, metronidazole.  Vaginal swab sent for STD testing.  Instructed patient not to have sex for 7 days.  Discussed with patient that we will call her if her test results are positive and her partner may need treatment at that time.  Patient agrees to treatment plan.     Final Clinical Impressions(s) / UC Diagnoses   Final diagnoses:  Vaginal discharge  Possible exposure to STD     Discharge Instructions     Your urine pregnancy test was negative.  A urine culture is being sent to check for infection.  You were treated here today with Rocephin, Zithromax, and metronidazole.    Your STD tests are pending.  Do not have sex for 7 days.  If your test results are positive, we will call you; your partner may need treatment also depending on what the test results show.           ED Prescriptions    None     Controlled Substance Prescriptions Industry Controlled Substance Registry consulted? Not Applicable   Mickie Bail, NP 11/24/18 1327

## 2018-11-24 NOTE — Discharge Instructions (Addendum)
Your urine pregnancy test was negative.  A urine culture is being sent to check for infection.  You were treated here today with Rocephin, Zithromax, and metronidazole.    Your STD tests are pending.  Do not have sex for 7 days.  If your test results are positive, we will call you; your partner may need treatment also depending on what the test results show.

## 2018-11-25 LAB — URINE CULTURE

## 2018-11-26 LAB — CERVICOVAGINAL ANCILLARY ONLY
Candida vaginitis: POSITIVE — AB
Chlamydia: NEGATIVE
Neisseria Gonorrhea: NEGATIVE
Trichomonas: POSITIVE — AB

## 2018-11-27 ENCOUNTER — Encounter (HOSPITAL_COMMUNITY): Payer: Self-pay

## 2018-11-27 ENCOUNTER — Telehealth (HOSPITAL_COMMUNITY): Payer: Self-pay | Admitting: Emergency Medicine

## 2018-11-27 MED ORDER — METRONIDAZOLE 500 MG PO TABS: 500.0000 mg | ORAL_TABLET | Freq: Two times a day (BID) | ORAL | 0 refills | Status: AC

## 2018-11-27 MED ORDER — FLUCONAZOLE 150 MG PO TABS: 150.0000 mg | ORAL_TABLET | Freq: Once | ORAL | 0 refills | Status: AC

## 2018-11-27 NOTE — Telephone Encounter (Signed)
Trichomonas is positive. Rx metronidazole was given at the urgent care visit. Pt needs education to please refrain from sexual intercourse for 7 days to give the medicine time to work. Sexual partners need to be notified and tested/treated. Condoms may reduce risk of reinfection. Recheck for further evaluation if symptoms are not improving.   Test for candida (yeast) was positive.  Prescription for fluconazole 150mg  po now, repeat dose in 3d if needed, #2 no refills, sent to the pharmacy of record.  Recheck or followup with PCP for further evaluation if symptoms are not improving.    Bacterial vaginosis is positive. This was not treated at the urgent care visit.  Flagyl 500 mg BID x 7 days #14 no refills sent to patients pharmacy of choice.    Attempted to reach patient. No answer at this time. Voicemail left.

## 2018-11-30 ENCOUNTER — Telehealth (HOSPITAL_COMMUNITY): Payer: Self-pay | Admitting: Emergency Medicine

## 2018-11-30 NOTE — Telephone Encounter (Signed)
Attempted to reach patient x2. No answer at this time. Voicemail left.    

## 2018-12-01 ENCOUNTER — Telehealth (HOSPITAL_COMMUNITY): Payer: Self-pay | Admitting: Emergency Medicine

## 2018-12-01 NOTE — Telephone Encounter (Signed)
Patient contacted and made aware of  Swab results  results, all questions answered

## 2019-04-13 ENCOUNTER — Inpatient Hospital Stay: Admission: RE | Admit: 2019-04-13 | Payer: Self-pay | Source: Ambulatory Visit

## 2019-04-19 ENCOUNTER — Other Ambulatory Visit: Payer: Self-pay

## 2019-04-19 ENCOUNTER — Encounter (HOSPITAL_COMMUNITY): Payer: Self-pay

## 2019-04-19 ENCOUNTER — Ambulatory Visit (HOSPITAL_COMMUNITY)
Admission: EM | Admit: 2019-04-19 | Discharge: 2019-04-19 | Disposition: A | Discharge status: Home / Self Care | Payer: Self-pay | Attending: Family Medicine | Admitting: Family Medicine

## 2019-04-19 DIAGNOSIS — N898 Other specified noninflammatory disorders of vagina: Secondary | ICD-10-CM | POA: Insufficient documentation

## 2019-04-19 MED ORDER — METRONIDAZOLE 500 MG PO TABS: 500.0000 mg | ORAL_TABLET | Freq: Two times a day (BID) | ORAL | 0 refills | Status: DC

## 2019-04-19 NOTE — ED Triage Notes (Signed)
Pt presents with vaginal discharge and vaginal odor X 1 week. 

## 2019-04-19 NOTE — ED Provider Notes (Signed)
[place]    CSN: [id number] Arrival date & time: 04/19/19  1040      History   Chief Complaint Chief Complaint  Patient presents with  . Vaginitis    HPI [name] is a 29 y.o. female.   29 year old female the presents today with increased vaginal discharge and odor.  Symptoms have been constant.  Past medical history of BV and feels the same.  Denies any abdominal pain, back pain, fevers, dysuria, hematuria or urinary frequency.  Has IUD for birth control.  Would like to also be checked for STDs.  ROS per HPI     Past Medical History:  Diagnosis Date  . Anemia   . Anemia   . Chlamydia 2012    Patient Active Problem List   Diagnosis Date Noted  . Hx of cesarean section complicating pregnancy 40/98/1191  . Echogenic focus of heart of fetus affecting antepartum care of mother 02/19/2012  . Supervision of high-risk pregnancy with insufficient prenatal care 02/19/2012  . Trichomonas vaginalis (TV) infection 01/31/2012  . Candida vaginitis 01/31/2012  . Round ligament pain 01/31/2012  . Hyperemesis gravidarum 08/14/2011    Past Surgical History:  Procedure Laterality Date  . CESAREAN SECTION    . TOOTH EXTRACTION  2010    OB History    Gravida  2   Para  2   Term  2   Preterm      AB      Living  2     SAB      TAB      Ectopic      Multiple      Live Births  2            Home Medications    Prior to Admission medications   Medication Sig Start Date End Date Taking? Authorizing Provider  levonorgestrel (MIRENA) 20 MCG/24HR IUD 1 each by Intrauterine route once. Place 12/2015    [provider]  metroNIDAZOLE (FLAGYL) 500 MG tablet Take 1 tablet (500 mg total) by mouth 2 (two) times daily. 04/19/19   Loura Halt A, NP  ondansetron (ZOFRAN ODT) 4 MG disintegrating tablet Take 1 tablet (4 mg total) by mouth every 8 (eight) hours as needed for nausea or vomiting. 03/18/18   Carlisle Cater, PA-C    Family  History Family History  Problem Relation Age of Onset  . Cancer Maternal Aunt   . Anesthesia problems Neg Hx   . Hypotension Neg Hx   . Malignant hyperthermia Neg Hx   . Pseudochol deficiency Neg Hx   . Other Neg Hx     Social History Social History   Tobacco Use  . Smoking status: Former Smoker    Packs/day: 1.00    Types: Cigarettes    Quit date: 07/12/2011    Years since quitting: 7.7  . Smokeless tobacco: Never Used  Substance Use Topics  . Alcohol use: No  . Drug use: Yes    Types: Marijuana     Allergies   Patient has no known allergies.   Review of Systems Review of Systems   Physical Exam Triage Vital Signs ED Triage Vitals  Enc Vitals Group     BP 04/19/19 1146 112/68     Pulse Rate 04/19/19 1146 (!) 58     Resp 04/19/19 1146 18     Temp 04/19/19 1146 98.1 F (36.7 C)     Temp Source 04/19/19 1146 Oral     SpO2 04/19/19  1146 99 %     Weight --      Height --      Head Circumference --      Peak Flow --      Pain Score 04/19/19 1148 0     Pain Loc --      Pain Edu? --      Excl. in GC? --    No data found.  Updated Vital Signs BP 112/68 (BP Location: Right Arm)   Pulse (!) 58   Temp 98.1 F (36.7 C) (Oral)   Resp 18   SpO2 99%   Visual Acuity Right Eye Distance:   Left Eye Distance:   Bilateral Distance:    Right Eye Near:   Left Eye Near:    Bilateral Near:     Physical Exam Vitals and nursing note reviewed.  Constitutional:      General: She is not in acute distress.    Appearance: Normal appearance. She is not ill-appearing, toxic-appearing or diaphoretic.  HENT:     Head: Normocephalic.     Nose: Nose normal.  Eyes:     Conjunctiva/sclera: Conjunctivae normal.  Pulmonary:     Effort: Pulmonary effort is normal.  Musculoskeletal:        General: Normal range of motion.     Cervical back: Normal range of motion.  Skin:    General: Skin is warm and dry.     Findings: No rash.  Neurological:     Mental Status: She is  alert.  Psychiatric:        Mood and Affect: Mood normal.      UC Treatments / Results  Labs (all labs ordered are listed, but only abnormal results are displayed) Labs Reviewed  CERVICOVAGINAL ANCILLARY ONLY    EKG   Radiology No results found.  Procedures Procedures (including critical care time)  Medications Ordered in UC Medications - No data to display  Initial Impression / Assessment and Plan / UC Course  I have reviewed the triage vital signs and the nursing notes.  Pertinent labs & imaging results that were available during my care of the patient were reviewed by me and considered in my medical decision making (see chart for details).     Vaginal discharge-treating for BV based on symptoms and history. Swab sent for testing with labs pending. Final Clinical Impressions(s) / UC Diagnoses   Final diagnoses:  Vaginal discharge     Discharge Instructions     Take the medication as prescribed Swab sent for testing.     ED Prescriptions    Medication Sig Dispense Auth. Provider   metroNIDAZOLE (FLAGYL) 500 MG tablet Take 1 tablet (500 mg total) by mouth 2 (two) times daily. 14 tablet Merion Caton A, NP     PDMP not reviewed this encounter.   Janace Aris, NP 04/19/19 1305

## 2019-04-19 NOTE — Discharge Instructions (Addendum)
Take the medication as prescribed Swab sent for testing.

## 2019-04-21 LAB — CERVICOVAGINAL ANCILLARY ONLY
Bacterial vaginitis: POSITIVE — AB
Candida vaginitis: NEGATIVE
Chlamydia: NEGATIVE
Neisseria Gonorrhea: NEGATIVE
Trichomonas: NEGATIVE

## 2019-07-31 ENCOUNTER — Encounter (HOSPITAL_COMMUNITY): Payer: Self-pay | Admitting: *Deleted

## 2019-07-31 ENCOUNTER — Ambulatory Visit (HOSPITAL_COMMUNITY)
Admission: EM | Admit: 2019-07-31 | Discharge: 2019-07-31 | Disposition: A | Discharge status: Home / Self Care | Payer: Self-pay | Attending: Emergency Medicine | Admitting: Emergency Medicine

## 2019-07-31 ENCOUNTER — Other Ambulatory Visit: Payer: Self-pay

## 2019-07-31 DIAGNOSIS — N760 Acute vaginitis: Secondary | ICD-10-CM | POA: Insufficient documentation

## 2019-07-31 DIAGNOSIS — B9689 Other specified bacterial agents as the cause of diseases classified elsewhere: Secondary | ICD-10-CM | POA: Insufficient documentation

## 2019-07-31 HISTORY — DX: Acute vaginitis: N76.0

## 2019-07-31 HISTORY — DX: Other specified bacterial agents as the cause of diseases classified elsewhere: B96.89

## 2019-07-31 MED ORDER — METRONIDAZOLE 500 MG PO TABS: 500.0000 mg | ORAL_TABLET | Freq: Two times a day (BID) | ORAL | 0 refills | Status: DC

## 2019-07-31 MED ORDER — ONDANSETRON 4 MG PO TBDP: 4.0000 mg | ORAL_TABLET | Freq: Three times a day (TID) | ORAL | 0 refills | Status: DC | PRN

## 2019-07-31 NOTE — Discharge Instructions (Addendum)
Begin metronidazole/flagyl twice daily for 1 week. No alcohol until 24 hours after last tablet.   We are testing you for Gonorrhea, Chlamydia, Trichomonas, Yeast and Bacterial Vaginosis. We will call you if anything is positive and let you know if you require any further treatment. Please inform partners of any positive results.   Please return if symptoms not improving with treatment, development of fever, nausea, vomiting, abdominal pain.

## 2019-07-31 NOTE — ED Triage Notes (Signed)
C/O vaginal discharge x 3 days.  States has BV frequently.

## 2019-08-01 NOTE — ED Provider Notes (Signed)
[facility]    CSN: [id number] Arrival date & time: 07/31/19  1110      History   Chief Complaint Chief Complaint  Patient presents with  . Vaginal Discharge    HPI [name] is a 29 y.o. female history of recurrent BV presenting today for evaluation of vaginal discharge.  Patient notes that she has had vaginal discharge as well as odor for a few days.  Feels very similar to prior BV symptoms.  Also with urinary urgency and mild lower abdominal pain.  Last menstrual cycle 4/10.  Has IUD in place.  Also would like to be screened for STDs.  HPI  Past Medical History:  Diagnosis Date  . Anemia   . Anemia   . BV (bacterial vaginosis)   . Chlamydia 2012    Patient Active Problem List   Diagnosis Date Noted  . Hx of cesarean section complicating pregnancy 02/19/2012  . Echogenic focus of heart of fetus affecting antepartum care of mother 02/19/2012  . Supervision of high-risk pregnancy with insufficient prenatal care 02/19/2012  . Trichomonas vaginalis (TV) infection 01/31/2012  . Candida vaginitis 01/31/2012  . Round ligament pain 01/31/2012  . Hyperemesis gravidarum 08/14/2011    Past Surgical History:  Procedure Laterality Date  . CESAREAN SECTION    . TOOTH EXTRACTION  2010    OB History    Gravida  2   Para  2   Term  2   Preterm      AB      Living  2     SAB      TAB      Ectopic      Multiple      Live Births  2            Home Medications    Prior to Admission medications   Medication Sig Start Date End Date Taking? Authorizing Provider  levonorgestrel (MIRENA) 20 MCG/24HR IUD 1 each by Intrauterine route once. Place 12/2015   Yes [provider]  metroNIDAZOLE (FLAGYL) 500 MG tablet Take 1 tablet (500 mg total) by mouth 2 (two) times daily. 07/31/19   Tzipora Mcinroy C, PA-C  ondansetron (ZOFRAN ODT) 4 MG disintegrating tablet Take 1 tablet (4 mg total) by mouth every 8 (eight) hours as needed for nausea or  vomiting. 07/31/19   Charene Mccallister, Junius Creamer, PA-C    Family History Family History  Problem Relation Age of Onset  . Cancer Maternal Aunt   . Healthy Mother   . Anesthesia problems Neg Hx   . Hypotension Neg Hx   . Malignant hyperthermia Neg Hx   . Pseudochol deficiency Neg Hx   . Other Neg Hx     Social History Social History   Tobacco Use  . Smoking status: Former Smoker    Packs/day: 1.00    Types: Cigarettes    Quit date: 07/12/2011    Years since quitting: 8.0  . Smokeless tobacco: Never Used  Substance Use Topics  . Alcohol use: Yes    Comment: occasionally  . Drug use: Yes    Types: Marijuana     Allergies   Patient has no known allergies.   Review of Systems Review of Systems  Constitutional: Negative for fever.  Respiratory: Negative for shortness of breath.   Cardiovascular: Negative for chest pain.  Gastrointestinal: Positive for abdominal pain. Negative for diarrhea, nausea and vomiting.  Genitourinary: Positive for urgency and vaginal discharge. Negative for dysuria, flank pain,  genital sores, hematuria, menstrual problem, vaginal bleeding and vaginal pain.  Musculoskeletal: Negative for back pain.  Skin: Negative for rash.  Neurological: Negative for dizziness, light-headedness and headaches.     Physical Exam Triage Vital Signs ED Triage Vitals  Enc Vitals Group     BP 07/31/19 1200 100/76     Pulse Rate 07/31/19 1200 (!) 58     Resp 07/31/19 1200 16     Temp 07/31/19 1200 98.3 F (36.8 C)     Temp Source 07/31/19 1200 Oral     SpO2 07/31/19 1200 100 %     Weight --      Height --      Head Circumference --      Peak Flow --      Pain Score 07/31/19 1201 3     Pain Loc --      Pain Edu? --      Excl. in [place]? --    No data found.  Updated Vital Signs BP 100/76   Pulse (!) 58   Temp 98.3 F (36.8 C) (Oral)   Resp 16   LMP 07/10/2019 (Approximate)   SpO2 100%   Visual Acuity Right Eye Distance:   Left Eye Distance:   Bilateral  Distance:    Right Eye Near:   Left Eye Near:    Bilateral Near:     Physical Exam Vitals and nursing note reviewed.  Constitutional:      Appearance: She is well-developed.     Comments: No acute distress  HENT:     Head: Normocephalic and atraumatic.     Nose: Nose normal.  Eyes:     Conjunctiva/sclera: Conjunctivae normal.  Cardiovascular:     Rate and Rhythm: Normal rate.  Pulmonary:     Effort: Pulmonary effort is normal. No respiratory distress.  Abdominal:     General: There is no distension.     Comments: Soft, nondistended, nontender to light deep palpation throughout abdomen  Musculoskeletal:        General: Normal range of motion.     Cervical back: Neck supple.  Skin:    General: Skin is warm and dry.  Neurological:     Mental Status: She is alert and oriented to person, place, and time.      UC Treatments / Results  Labs (all labs ordered are listed, but only abnormal results are displayed) Labs Reviewed  CERVICOVAGINAL ANCILLARY ONLY    EKG   Radiology No results found.  Procedures Procedures (including critical care time)  Medications Ordered in UC Medications - No data to display  Initial Impression / Assessment and Plan / UC Course  I have reviewed the triage vital signs and the nursing notes.  Pertinent labs & imaging results that were available during my care of the patient were reviewed by me and considered in my medical decision making (see chart for details).     Empirically treating for BV with Flagyl.  Vaginal swab pending.  Will call with results if needing to alter treatment.  Discussed strict return precautions. Patient verbalized understanding and is agreeable with plan.  Final Clinical Impressions(s) / UC Diagnoses   Final diagnoses:  Bacterial vaginosis     Discharge Instructions     Begin metronidazole/flagyl twice daily for 1 week. No alcohol until 24 hours after last tablet.   We are testing you for Gonorrhea,  Chlamydia, Trichomonas, Yeast and Bacterial Vaginosis. We will call you if anything is positive and let  you know if you require any further treatment. Please inform partners of any positive results.   Please return if symptoms not improving with treatment, development of fever, nausea, vomiting, abdominal pain.    ED Prescriptions    Medication Sig Dispense Auth. Provider   metroNIDAZOLE (FLAGYL) 500 MG tablet Take 1 tablet (500 mg total) by mouth 2 (two) times daily. 14 tablet Kenadee Gates C, PA-C   ondansetron (ZOFRAN ODT) 4 MG disintegrating tablet Take 1 tablet (4 mg total) by mouth every 8 (eight) hours as needed for nausea or vomiting. 20 tablet Jaydrian Corpening, [employer] C, PA-C     PDMP not reviewed this encounter.   Lew Dawes, [place] 08/01/19 [phone]

## 2019-08-02 LAB — CERVICOVAGINAL ANCILLARY ONLY
Bacterial Vaginitis (gardnerella): POSITIVE — AB
Candida Glabrata: NEGATIVE
Candida Vaginitis: POSITIVE — AB
Chlamydia: NEGATIVE
Comment: NEGATIVE
Comment: NEGATIVE
Comment: NEGATIVE
Comment: NEGATIVE
Comment: NEGATIVE
Comment: NORMAL
Neisseria Gonorrhea: NEGATIVE
Trichomonas: POSITIVE — AB

## 2019-08-03 ENCOUNTER — Telehealth (HOSPITAL_COMMUNITY): Payer: Self-pay

## 2019-08-03 MED ORDER — FLUCONAZOLE 150 MG PO TABS: 150.0000 mg | ORAL_TABLET | Freq: Every day | ORAL | 0 refills | Status: AC

## 2019-10-06 ENCOUNTER — Encounter (HOSPITAL_COMMUNITY): Payer: Self-pay

## 2019-10-06 ENCOUNTER — Other Ambulatory Visit: Payer: Self-pay

## 2019-10-06 ENCOUNTER — Ambulatory Visit (HOSPITAL_COMMUNITY)
Admission: EM | Admit: 2019-10-06 | Discharge: 2019-10-06 | Disposition: A | Discharge status: Home / Self Care | Payer: Medicaid Other | Attending: Emergency Medicine | Admitting: Emergency Medicine

## 2019-10-06 DIAGNOSIS — M545 Low back pain, unspecified: Secondary | ICD-10-CM

## 2019-10-06 DIAGNOSIS — Z3202 Encounter for pregnancy test, result negative: Secondary | ICD-10-CM

## 2019-10-06 DIAGNOSIS — M549 Dorsalgia, unspecified: Secondary | ICD-10-CM

## 2019-10-06 LAB — POCT URINALYSIS DIP (DEVICE)
Bilirubin Urine: NEGATIVE
Glucose, UA: NEGATIVE mg/dL
Ketones, ur: NEGATIVE mg/dL
Leukocytes,Ua: NEGATIVE
Nitrite: NEGATIVE
Protein, ur: NEGATIVE mg/dL
Specific Gravity, Urine: 1.02 (ref 1.005–1.030)
Urobilinogen, UA: 0.2 mg/dL (ref 0.0–1.0)
pH: 7.5 (ref 5.0–8.0)

## 2019-10-06 LAB — POC URINE PREG, ED: Preg Test, Ur: NEGATIVE

## 2019-10-06 MED ORDER — METRONIDAZOLE 500 MG PO TABS: 500.0000 mg | ORAL_TABLET | Freq: Two times a day (BID) | ORAL | 0 refills | Status: DC

## 2019-10-06 MED ORDER — NAPROXEN 500 MG PO TABS: 500.0000 mg | ORAL_TABLET | Freq: Two times a day (BID) | ORAL | 0 refills | Status: DC

## 2019-10-06 NOTE — ED Provider Notes (Signed)
[facility]    CSN: [id number] Arrival date & time: 10/06/19  1335      History   Chief Complaint Chief Complaint  Patient presents with  . Back Pain    HPI [name] is a 29 y.o. female.   [name] presents with complaints of bilateral low back pain. No injury. No pain with movement or known trigger. Some frequent urination for the past week but no pain with urination. States she has had some vaginal discharge but minimal, no odor. She is currently menstruating. Denies abdominal or pelvic pain. No fevers. States she feels concern for bacterial vaginosis which she has had in the past. Sexually active, denies any known concern for STDs.    ROS per HPI, negative if not otherwise mentioned.      Past Medical History:  Diagnosis Date  . Anemia   . Anemia   . BV (bacterial vaginosis)   . Chlamydia 2012    Patient Active Problem List   Diagnosis Date Noted  . Hx of cesarean section complicating pregnancy 02/19/2012  . Echogenic focus of heart of fetus affecting antepartum care of mother 02/19/2012  . Supervision of high-risk pregnancy with insufficient prenatal care 02/19/2012  . Trichomonas vaginalis (TV) infection 01/31/2012  . Candida vaginitis 01/31/2012  . Round ligament pain 01/31/2012  . Hyperemesis gravidarum 08/14/2011    Past Surgical History:  Procedure Laterality Date  . CESAREAN SECTION    . TOOTH EXTRACTION  2010    OB History    Gravida  2   Para  2   Term  2   Preterm      AB      Living  2     SAB      TAB      Ectopic      Multiple      Live Births  2            Home Medications    Prior to Admission medications   Medication Sig Start Date End Date Taking? Authorizing Provider  levonorgestrel (MIRENA) 20 MCG/24HR IUD 1 each by Intrauterine route once. Place 12/2015    [provider]  metroNIDAZOLE (FLAGYL) 500 MG tablet Take 1 tablet (500 mg total) by mouth 2 (two) times daily. 10/06/19    Georgetta Haber, NP  naproxen (NAPROSYN) 500 MG tablet Take 1 tablet (500 mg total) by mouth 2 (two) times daily. 10/06/19   Georgetta Haber, NP  ondansetron (ZOFRAN ODT) 4 MG disintegrating tablet Take 1 tablet (4 mg total) by mouth every 8 (eight) hours as needed for nausea or vomiting. 07/31/19   Wieters, Junius Creamer, PA-C    Family History Family History  Problem Relation Age of Onset  . Cancer Maternal Aunt   . Healthy Mother   . Anesthesia problems Neg Hx   . Hypotension Neg Hx   . Malignant hyperthermia Neg Hx   . Pseudochol deficiency Neg Hx   . Other Neg Hx     Social History Social History   Tobacco Use  . Smoking status: Former Smoker    Packs/day: 1.00    Types: Cigarettes    Quit date: 07/12/2011    Years since quitting: 8.2  . Smokeless tobacco: Never Used  Vaping Use  . Vaping Use: Never used  Substance Use Topics  . Alcohol use: Yes    Comment: occasionally  . Drug use: Yes    Types: Marijuana     Allergies  Patient has no known allergies.   Review of Systems Review of Systems   Physical Exam Triage Vital Signs ED Triage Vitals  Enc Vitals Group     BP 10/06/19 1533 111/78     Pulse Rate 10/06/19 1533 84     Resp 10/06/19 1533 18     Temp 10/06/19 1533 97.8 F (36.6 C)     Temp Source 10/06/19 1533 Oral     SpO2 10/06/19 1533 100 %     Weight --      Height --      Head Circumference --      Peak Flow --      Pain Score 10/06/19 1532 6     Pain Loc --      Pain Edu? --      Excl. in GC? --    No data found.  Updated Vital Signs BP 111/78 (BP Location: Right Arm)   Pulse 84   Temp 97.8 F (36.6 C) (Oral)   Resp 18   SpO2 100%    Physical Exam Constitutional:      General: She is not in acute distress.    Appearance: She is well-developed.  Cardiovascular:     Rate and Rhythm: Normal rate.  Pulmonary:     Effort: Pulmonary effort is normal.  Abdominal:     Palpations: Abdomen is not rigid.     Tenderness: There is no  abdominal tenderness. There is no guarding or rebound.  Genitourinary:    Comments: Denies sores, lesions, vaginal bleeding; no pelvic pain; gu exam deferred at this time, vaginal self swab collected.   Musculoskeletal:        General: No tenderness or signs of injury. Normal range of motion.     Lumbar back: No swelling, tenderness or bony tenderness. Normal range of motion. Negative right straight leg raise test and negative left straight leg raise test.  Skin:    General: Skin is warm and dry.  Neurological:     Mental Status: She is alert and oriented to person, place, and time.      UC Treatments / Results  Labs (all labs ordered are listed, but only abnormal results are displayed) Labs Reviewed  POCT URINALYSIS DIP (DEVICE) - Abnormal; Notable for the following components:      Result Value   Hgb urine dipstick TRACE (*)    All other components within normal limits  CERVICOVAGINAL ANCILLARY ONLY - Abnormal; Notable for the following components:   Candida Vaginitis Positive (*)    All other components within normal limits  POC URINE PREG, ED    EKG   Radiology No results found.  Procedures Procedures (including critical care time)  Medications Ordered in UC Medications - No data to display  Initial Impression / Assessment and Plan / UC Course  I have reviewed the triage vital signs and the nursing notes.  Pertinent labs & imaging results that were available during my care of the patient were reviewed by me and considered in my medical decision making (see chart for details).     Urine unremarkable, menstruating. Exam without red flag findings. Vaginal cytology collected and pending with flagyl initiated pending results. Will notify of any positive findings. Return precautions provided. Patient verbalized understanding and agreeable to plan.   Final Clinical Impressions(s) / UC Diagnoses   Final diagnoses:  Acute bilateral low back pain without sciatica      Discharge Instructions     I would recommend  waiting to see final results of your vaginal testing before starting medication, as sometimes your period can affect your vaginal symptoms.  Naproxen twice a day, may help with back pain, take with food.  We will notify of you any positive findings or if any changes to treatment are needed. If normal or otherwise without concern to your results, we will not call you. Please log on to your MyChart to review your results if interested in so.      ED Prescriptions    Medication Sig Dispense Auth. Provider   metroNIDAZOLE (FLAGYL) 500 MG tablet Take 1 tablet (500 mg total) by mouth 2 (two) times daily. 14 tablet Linus Mako B, NP   naproxen (NAPROSYN) 500 MG tablet Take 1 tablet (500 mg total) by mouth 2 (two) times daily. 30 tablet Georgetta Haber, NP     PDMP not reviewed this encounter.   Georgetta Haber, NP 10/08/19 [phone]

## 2019-10-06 NOTE — ED Triage Notes (Signed)
Pt presents with recurrent lower back pain X 1 week.  

## 2019-10-06 NOTE — Discharge Instructions (Signed)
I would recommend waiting to see final results of your vaginal testing before starting medication, as sometimes your period can affect your vaginal symptoms.  Naproxen twice a day, may help with back pain, take with food.  We will notify of you any positive findings or if any changes to treatment are needed. If normal or otherwise without concern to your results, we will not call you. Please log on to your MyChart to review your results if interested in so.

## 2019-10-07 LAB — CERVICOVAGINAL ANCILLARY ONLY
Bacterial Vaginitis (gardnerella): NEGATIVE
Candida Glabrata: NEGATIVE
Candida Vaginitis: POSITIVE — AB
Chlamydia: NEGATIVE
Comment: NEGATIVE
Comment: NEGATIVE
Comment: NEGATIVE
Comment: NEGATIVE
Comment: NEGATIVE
Comment: NORMAL
Neisseria Gonorrhea: NEGATIVE
Trichomonas: NEGATIVE

## 2019-10-08 ENCOUNTER — Telehealth (HOSPITAL_COMMUNITY): Payer: Self-pay | Admitting: Emergency Medicine

## 2019-12-30 ENCOUNTER — Other Ambulatory Visit: Payer: Medicaid Other

## 2020-02-11 IMAGING — CR DG ABDOMEN ACUTE W/ 1V CHEST
3 series · 3 of 3 positions shown · non-contrast
Comparison: None.

CLINICAL DATA: Cough with vomiting and abdominal pain.

EXAM:
DG ABDOMEN ACUTE W/ 1V CHEST

[w chest pa]
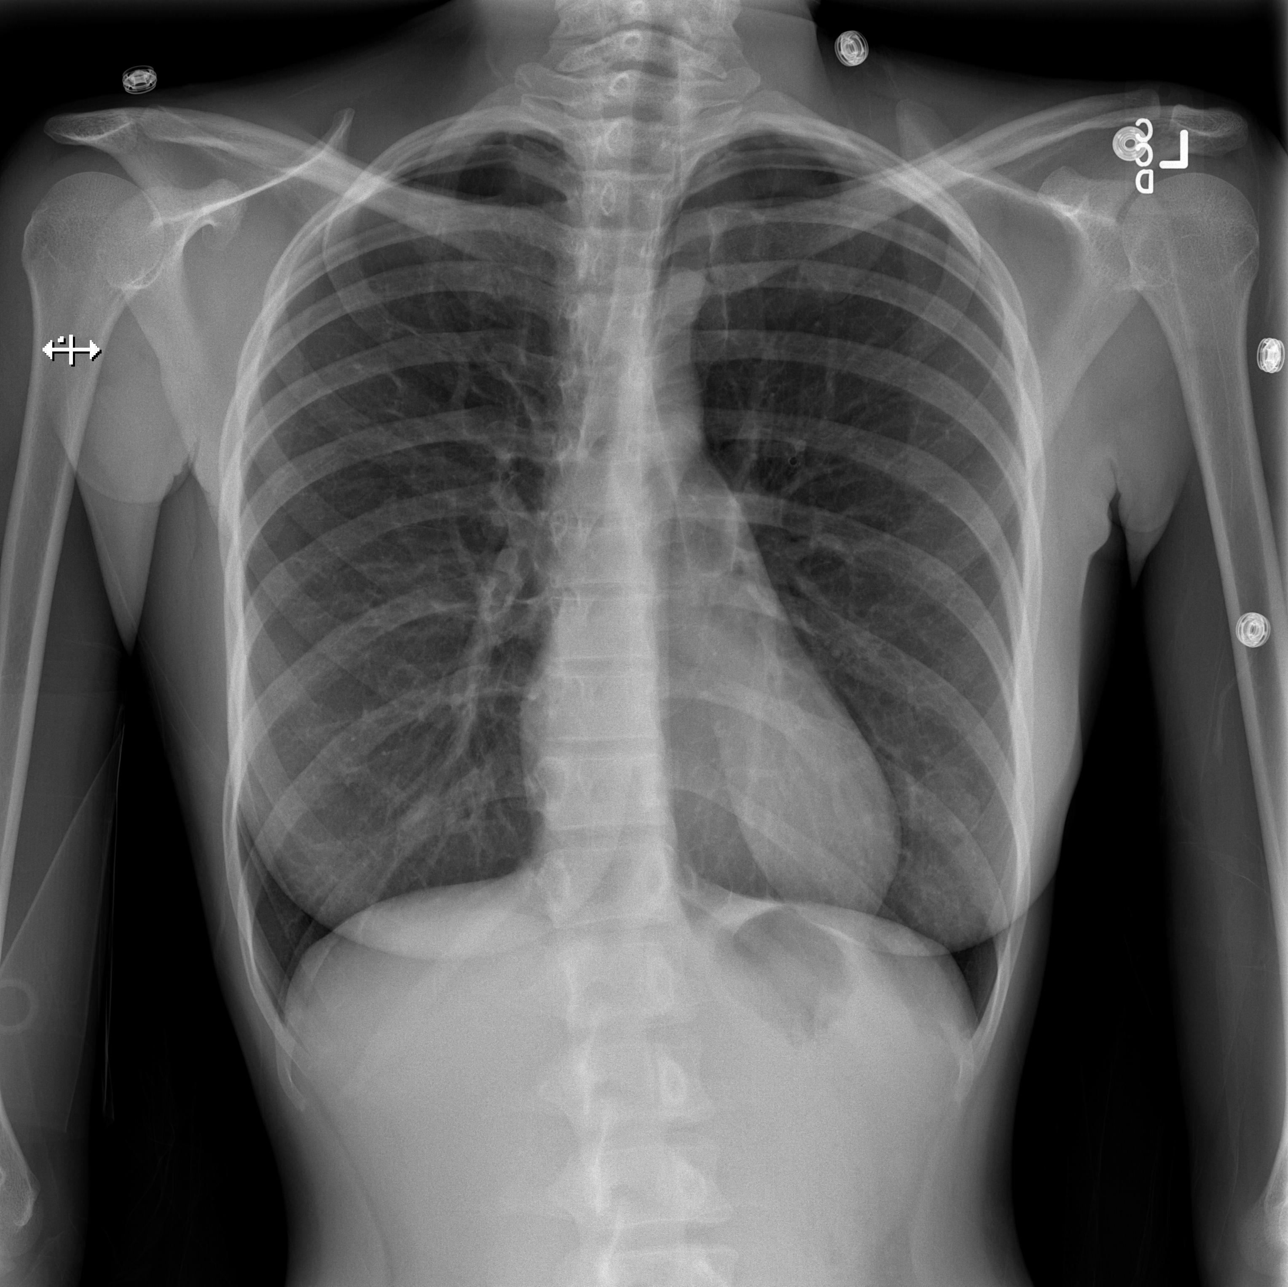

[w abdomen upright]
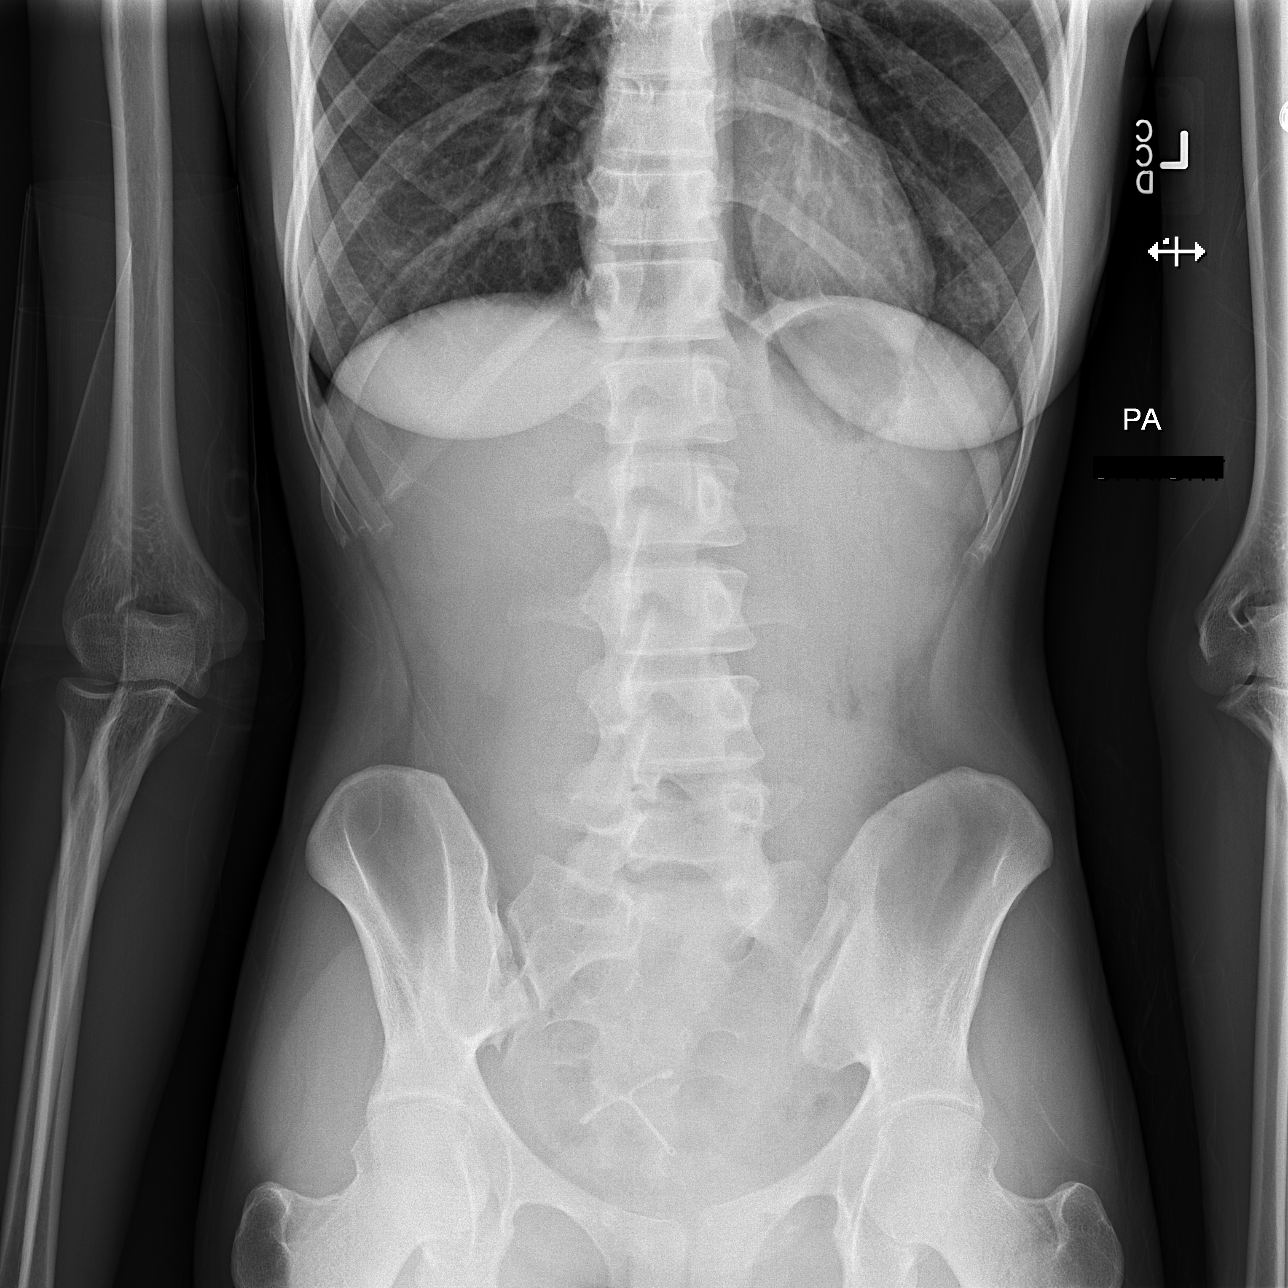

[t abdomen supine]
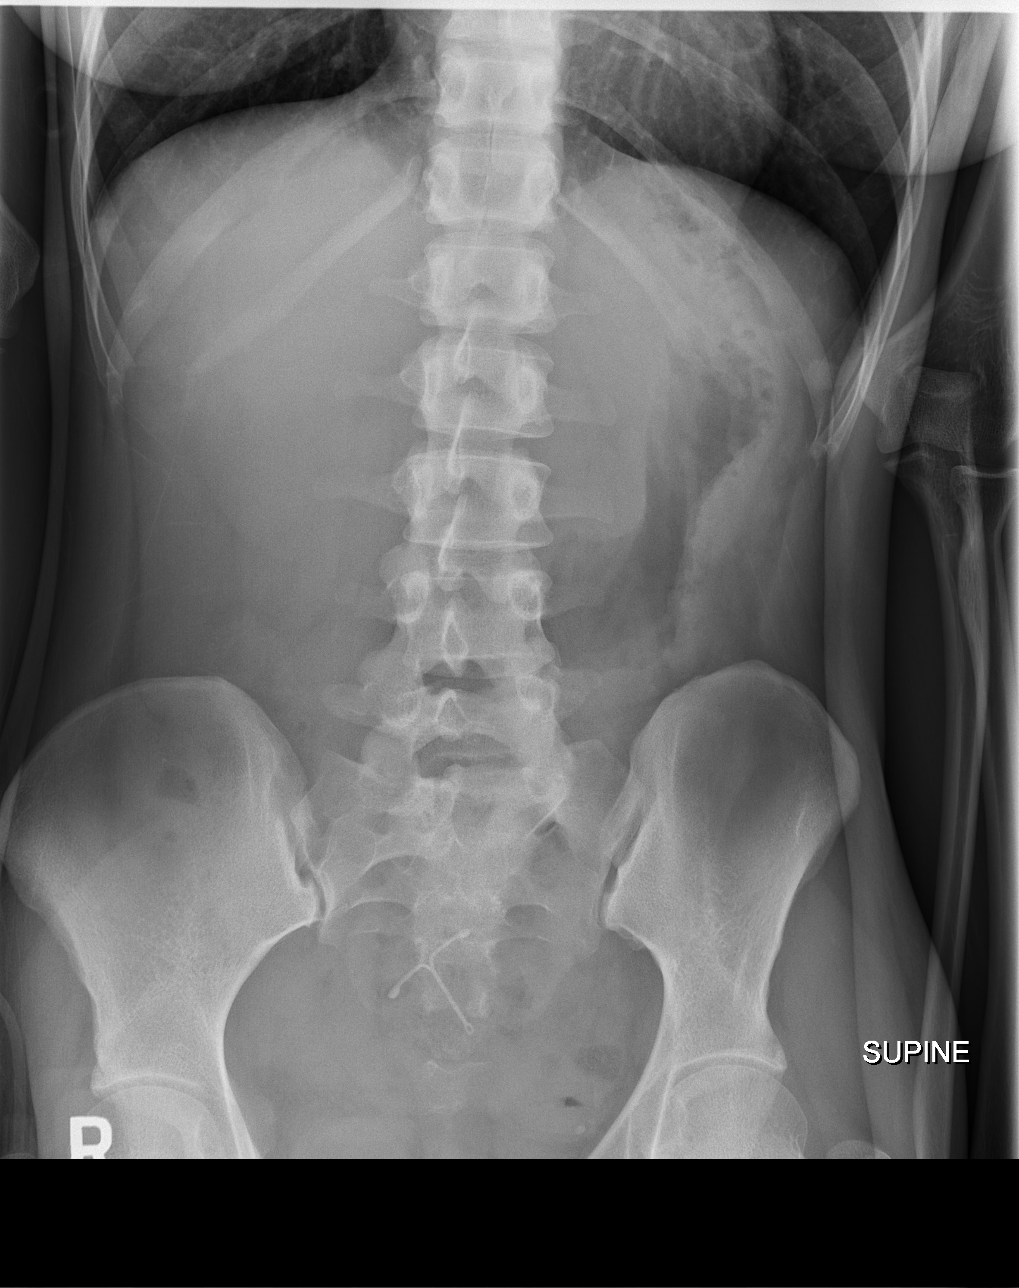

[3 of 3 positions shown; findings below may reference images not displayed]

FINDINGS: Both lungs are clear. Heart and mediastinum are normal. Negative for
a pneumothorax. Gas in the stomach. Otherwise, paucity of bowel gas
throughout the abdomen. Patient has an IUD. No large abdominal
calcifications. Left pelvic calcification is suggestive for a
phlebolith. Difficult to exclude mild rotary scoliosis in the lumbar
spine. Negative for free air.
IMPRESSION: Negative abdominal radiographs.  No acute cardiopulmonary disease.

IUD present.

## 2020-03-28 ENCOUNTER — Ambulatory Visit (HOSPITAL_COMMUNITY)
Admission: EM | Admit: 2020-03-28 | Discharge: 2020-03-28 | Disposition: A | Discharge status: Home / Self Care | Payer: Medicaid Other | Attending: Family Medicine | Admitting: Family Medicine

## 2020-03-28 ENCOUNTER — Other Ambulatory Visit: Payer: Self-pay

## 2020-03-28 ENCOUNTER — Encounter (HOSPITAL_COMMUNITY): Payer: Self-pay

## 2020-03-28 DIAGNOSIS — Z20822 Contact with and (suspected) exposure to covid-19: Secondary | ICD-10-CM

## 2020-03-28 DIAGNOSIS — U071 COVID-19: Secondary | ICD-10-CM | POA: Insufficient documentation

## 2020-03-28 DIAGNOSIS — J069 Acute upper respiratory infection, unspecified: Secondary | ICD-10-CM | POA: Insufficient documentation

## 2020-03-28 LAB — SARS CORONAVIRUS 2 (TAT 6-24 HRS): SARS Coronavirus 2: POSITIVE — AB

## 2020-03-28 NOTE — ED Triage Notes (Signed)
Pt c/o chest pain, generalized body aches, chills, nausea x 4 days.  Pt states her kids are covid positive.  Pt denies SOB and vomiting.

## 2020-03-28 NOTE — Discharge Instructions (Addendum)
You have been tested for COVID-19 today. °If your test returns positive, you will receive a phone call from [facility] regarding your results. °Negative test results are not called. °Both positive and negative results area always visible on MyChart. °If you do not have a MyChart account, sign up instructions are provided in your discharge papers. °Please do not hesitate to contact us should you have questions or concerns. ° °

## 2020-03-28 NOTE — ED Provider Notes (Signed)
  [facility]   [id number] 03/28/20 Arrival Time: 7494  ASSESSMENT & PLAN:  1. Viral upper respiratory tract infection   2. Close exposure to COVID-19 virus     COVID-19 testing sent. See letter/work note on file for self-isolation guidelines. OTC symptom care as needed.     Follow-up Information     [facility]gent [facility] at [facility]   Specialty: Urgent Care Why: As needed. Contact information: [address] [place] [address] [phone]              Reviewed expectations re: course of current medical issues. Questions answered. Outlined signs and symptoms indicating need for more acute intervention. Understanding verbalized. After Visit Summary given.   SUBJECTIVE: History from: patient. [name] is a 29 y.o. female who presents with worries regarding COVID-19. Known COVID-19 contact: children +. Recent travel: none. Reports: chest pain from coughing, body aches, chills, nausea without emesis; 3-4 days. Denies: fever. Normal PO intake without n/v/d.    OBJECTIVE:  Vitals:   03/28/20 1029  BP: 107/67  Pulse: 69  Resp: 18  Temp: 98.4 F (36.9 C)  SpO2: 100%    General appearance: alert; no distress Eyes: conjunctiva normal HENT: [place]; AT; with nasal congestion Neck: supple  Lungs: speaks full sentences without difficulty; unlabored Extremities: no edema Skin: warm and dry Neurologic: normal gait Psychological: alert and cooperative; normal mood and affect  Labs:  Labs Reviewed  SARS CORONAVIRUS 2 (TAT 6-24 HRS)     No Known Allergies  Past Medical History:  Diagnosis Date  . Anemia   . Anemia   . BV (bacterial vaginosis)   . Chlamydia 2012   Social History   Socioeconomic History  . Marital status: Single    Spouse name: Not on file  . Number of children: Not on file  . Years of education: Not on file  . Highest education level: Not on file  Occupational History  . Not on file  Tobacco Use  .  Smoking status: Former Smoker    Packs/day: 1.00    Types: Cigarettes    Quit date: 07/12/2011    Years since quitting: 8.7  . Smokeless tobacco: Never Used  Vaping Use  . Vaping Use: Never used  Substance and Sexual Activity  . Alcohol use: Yes    Comment: occasionally  . Drug use: Yes    Types: Marijuana  . Sexual activity: Yes    Birth control/protection: I.U.D.  Other Topics Concern  . Not on file  Social History Narrative  . Not on file   Social Determinants of Health   Financial Resource Strain: Not on file  Food Insecurity: Not on file  Transportation Needs: Not on file  Physical Activity: Not on file  Stress: Not on file  Social Connections: Not on file  Intimate Partner Violence: Not on file   Family History  Problem Relation Age of Onset  . Cancer Maternal Aunt   . Healthy Mother   . Anesthesia problems Neg Hx   . Hypotension Neg Hx   . Malignant hyperthermia Neg Hx   . Pseudochol deficiency Neg Hx   . Other Neg Hx    Past Surgical History:  Procedure Laterality Date  . CESAREAN SECTION    . TOOTH EXTRACTION  2010     Mardella Layman, MD 03/28/20 1050

## 2020-04-30 ENCOUNTER — Other Ambulatory Visit: Payer: Self-pay

## 2020-04-30 ENCOUNTER — Ambulatory Visit (HOSPITAL_COMMUNITY)
Admission: EM | Admit: 2020-04-30 | Discharge: 2020-04-30 | Disposition: A | Discharge status: Home / Self Care | Payer: Medicaid Other | Attending: Emergency Medicine | Admitting: Emergency Medicine

## 2020-04-30 ENCOUNTER — Encounter (HOSPITAL_COMMUNITY): Payer: Self-pay

## 2020-04-30 DIAGNOSIS — N760 Acute vaginitis: Secondary | ICD-10-CM

## 2020-04-30 LAB — POCT URINALYSIS DIPSTICK, ED / UC
Bilirubin Urine: NEGATIVE
Glucose, UA: NEGATIVE mg/dL
Ketones, ur: NEGATIVE mg/dL
Leukocytes,Ua: NEGATIVE
Nitrite: NEGATIVE
Protein, ur: NEGATIVE mg/dL
Specific Gravity, Urine: >=1.03 (ref 1.005–1.030)
Urobilinogen, UA: 0.2 mg/dL (ref 0.0–1.0)
pH: 6.5 (ref 5.0–8.0)

## 2020-04-30 MED ORDER — METRONIDAZOLE 500 MG PO TABS: 500.0000 mg | ORAL_TABLET | Freq: Two times a day (BID) | ORAL | 0 refills | Status: DC

## 2020-04-30 NOTE — ED Provider Notes (Signed)
[facility]    CSN: [id number] Arrival date & time: 04/30/20  1302      History   Chief Complaint Chief Complaint  Patient presents with  . Urinary Frequency    HPI [name] is a 30 y.o. female.   Here today with about a week of urinary frequency and slight vaginal odor. Some intermittent mild suprapubic pain as well. Denies dysuria, hematuria, flank pain, N/V/D, abdominal pain. Denies concern for STIs or pregnancy. IUD in place. Not trying anything OTC for sxs. Hx of BV infections that have felt similar to her.      Past Medical History:  Diagnosis Date  . Anemia   . Anemia   . BV (bacterial vaginosis)   . Chlamydia 2012    Patient Active Problem List   Diagnosis Date Noted  . Hx of cesarean section complicating pregnancy 02/19/2012  . Echogenic focus of heart of fetus affecting antepartum care of mother 02/19/2012  . Supervision of high-risk pregnancy with insufficient prenatal care 02/19/2012  . Trichomonas vaginalis (TV) infection 01/31/2012  . Candida vaginitis 01/31/2012  . Round ligament pain 01/31/2012  . Hyperemesis gravidarum 08/14/2011    Past Surgical History:  Procedure Laterality Date  . CESAREAN SECTION    . TOOTH EXTRACTION  2010    OB History    Gravida  2   Para  2   Term  2   Preterm      AB      Living  2     SAB      IAB      Ectopic      Multiple      Live Births  2            Home Medications    Prior to Admission medications   Medication Sig Start Date End Date Taking? Authorizing Provider  levonorgestrel (MIRENA) 20 MCG/24HR IUD 1 each by Intrauterine route once. Place 12/2015    [provider]  metroNIDAZOLE (FLAGYL) 500 MG tablet Take 1 tablet (500 mg total) by mouth 2 (two) times daily. 04/30/20   Particia Nearing, PA-C  naproxen (NAPROSYN) 500 MG tablet Take 1 tablet (500 mg total) by mouth 2 (two) times daily. 10/06/19   Georgetta Haber, NP  ondansetron (ZOFRAN ODT) 4 MG  disintegrating tablet Take 1 tablet (4 mg total) by mouth every 8 (eight) hours as needed for nausea or vomiting. 07/31/19   Wieters, Junius Creamer, PA-C    Family History Family History  Problem Relation Age of Onset  . Cancer Maternal Aunt   . Healthy Mother   . Anesthesia problems Neg Hx   . Hypotension Neg Hx   . Malignant hyperthermia Neg Hx   . Pseudochol deficiency Neg Hx   . Other Neg Hx     Social History Social History   Tobacco Use  . Smoking status: Former Smoker    Packs/day: 1.00    Types: Cigarettes    Quit date: 07/12/2011    Years since quitting: 8.8  . Smokeless tobacco: Never Used  Vaping Use  . Vaping Use: Never used  Substance Use Topics  . Alcohol use: Yes    Comment: occasionally  . Drug use: Yes    Types: Marijuana     Allergies   Patient has no known allergies.   Review of Systems Review of Systems PER HPI    Physical Exam Triage Vital Signs ED Triage Vitals [04/30/20 1431]  Enc Vitals  Group     BP 118/74     Pulse Rate 66     Resp 16     Temp 98.8 F (37.1 C)     Temp Source Oral     SpO2 99 %     Weight      Height      Head Circumference      Peak Flow      Pain Score 0     Pain Loc      Pain Edu?      Excl. in GC?    No data found.  Updated Vital Signs BP 118/74 (BP Location: Right Arm)   Pulse 66   Temp 98.8 F (37.1 C) (Oral)   Resp 16   SpO2 99%   Visual Acuity Right Eye Distance:   Left Eye Distance:   Bilateral Distance:    Right Eye Near:   Left Eye Near:    Bilateral Near:     Physical Exam Vitals and nursing note reviewed.  Constitutional:      Appearance: Normal appearance. She is not ill-appearing.  HENT:     Head: Atraumatic.     Mouth/Throat:     Mouth: Mucous membranes are moist.     Pharynx: Oropharynx is clear.  Eyes:     Extraocular Movements: Extraocular movements intact.     Conjunctiva/sclera: Conjunctivae normal.  Cardiovascular:     Rate and Rhythm: Normal rate and regular  rhythm.     Heart sounds: Normal heart sounds.  Pulmonary:     Effort: Pulmonary effort is normal.     Breath sounds: Normal breath sounds.  Abdominal:     General: Bowel sounds are normal. There is no distension.     Palpations: Abdomen is soft.     Tenderness: There is no abdominal tenderness. There is no right CVA tenderness, left CVA tenderness or guarding.  Genitourinary:    Comments: GU exam deferred, self swab performed Musculoskeletal:        General: Normal range of motion.     Cervical back: Normal range of motion and neck supple.  Skin:    General: Skin is warm and dry.  Neurological:     Mental Status: She is alert and oriented to person, place, and time.  Psychiatric:        Mood and Affect: Mood normal.        Thought Content: Thought content normal.        Judgment: Judgment normal.      UC Treatments / Results  Labs (all labs ordered are listed, but only abnormal results are displayed) Labs Reviewed  POCT URINALYSIS DIPSTICK, ED / UC - Abnormal; Notable for the following components:      Result Value   Hgb urine dipstick TRACE (*)    All other components within normal limits  CERVICOVAGINAL ANCILLARY ONLY    EKG   Radiology No results found.  Procedures Procedures (including critical care time)  Medications Ordered in UC Medications - No data to display  Initial Impression / Assessment and Plan / UC Course  I have reviewed the triage vital signs and the nursing notes.  Pertinent labs & imaging results that were available during my care of the patient were reviewed by me and considered in my medical decision making (see chart for details).     Exam and vitals reassuring, U/A wtihout evidence of UTI. Aptima swab pending, suspect BV infection and will send flagyl course and await  results. Abstinence until results return. Return if sxs worsening.   Final Clinical Impressions(s) / UC Diagnoses   Final diagnoses:  Acute vaginitis   Discharge  Instructions   None    ED Prescriptions    Medication Sig Dispense Auth. Provider   metroNIDAZOLE (FLAGYL) 500 MG tablet Take 1 tablet (500 mg total) by mouth 2 (two) times daily. 14 tablet Particia Nearing, [place]     PDMP not reviewed this encounter.   Particia Nearing, [place] 04/30/20 1501

## 2020-04-30 NOTE — ED Triage Notes (Signed)
Pt present urinary frequency with slightly odor smell. symptoms started a week ago.

## 2020-05-01 ENCOUNTER — Telehealth (HOSPITAL_COMMUNITY): Payer: Self-pay | Admitting: Emergency Medicine

## 2020-05-01 LAB — CERVICOVAGINAL ANCILLARY ONLY
Bacterial Vaginitis (gardnerella): POSITIVE — AB
Candida Glabrata: NEGATIVE
Candida Vaginitis: POSITIVE — AB
Chlamydia: NEGATIVE
Comment: NEGATIVE
Comment: NEGATIVE
Comment: NEGATIVE
Comment: NEGATIVE
Comment: NEGATIVE
Comment: NORMAL
Neisseria Gonorrhea: NEGATIVE
Trichomonas: POSITIVE — AB

## 2020-05-01 MED ORDER — FLUCONAZOLE 150 MG PO TABS: 150.0000 mg | ORAL_TABLET | Freq: Once | ORAL | 0 refills | Status: DC

## 2020-05-01 MED ORDER — FLUCONAZOLE 150 MG PO TABS: 150.0000 mg | ORAL_TABLET | Freq: Once | ORAL | 0 refills | Status: AC

## 2020-08-03 ENCOUNTER — Ambulatory Visit (HOSPITAL_COMMUNITY)
Admission: EM | Admit: 2020-08-03 | Discharge: 2020-08-03 | Disposition: A | Discharge status: Home / Self Care | Payer: Medicaid Other | Attending: Emergency Medicine | Admitting: Emergency Medicine

## 2020-08-03 ENCOUNTER — Other Ambulatory Visit: Payer: Self-pay

## 2020-08-03 ENCOUNTER — Other Ambulatory Visit: Payer: Self-pay | Admitting: Emergency Medicine

## 2020-08-03 ENCOUNTER — Encounter (HOSPITAL_COMMUNITY): Payer: Self-pay

## 2020-08-03 ENCOUNTER — Inpatient Hospital Stay (HOSPITAL_COMMUNITY): Admission: RE | Admit: 2020-08-03 | Payer: Medicaid Other | Source: Ambulatory Visit

## 2020-08-03 DIAGNOSIS — N898 Other specified noninflammatory disorders of vagina: Secondary | ICD-10-CM | POA: Insufficient documentation

## 2020-08-03 MED ORDER — METRONIDAZOLE 500 MG PO TABS: 500.0000 mg | ORAL_TABLET | Freq: Two times a day (BID) | ORAL | 0 refills | Status: DC

## 2020-08-03 MED ORDER — ONDANSETRON HCL 4 MG PO TABS: 4.0000 mg | ORAL_TABLET | Freq: Two times a day (BID) | ORAL | 0 refills | Status: DC

## 2020-08-03 NOTE — ED Provider Notes (Signed)
[facility]    CSN: [id number] Arrival date & time: 08/03/20  3016      History   Chief Complaint Chief Complaint  Patient presents with  . Vaginal Discharge    HPI [name] is a 30 y.o. female.   Patient presents with thick [place] discharge with fishy odor and urinary frequency for 1 week.  Denies itching, urinary urgency, rash, lesions, vaginal irritation, abdominal pain, flank pain.  Not currently sexually active.  Has not attempted treatment.  Past Medical History:  Diagnosis Date  . Anemia   . Anemia   . BV (bacterial vaginosis)   . Chlamydia 2012    Patient Active Problem List   Diagnosis Date Noted  . Hx of cesarean section complicating pregnancy 02/19/2012  . Echogenic focus of heart of fetus affecting antepartum care of mother 02/19/2012  . Supervision of high-risk pregnancy with insufficient prenatal care 02/19/2012  . Trichomonas vaginalis (TV) infection 01/31/2012  . Candida vaginitis 01/31/2012  . Round ligament pain 01/31/2012  . Hyperemesis gravidarum 08/14/2011    Past Surgical History:  Procedure Laterality Date  . CESAREAN SECTION    . TOOTH EXTRACTION  2010    OB History    Gravida  2   Para  2   Term  2   Preterm      AB      Living  2     SAB      IAB      Ectopic      Multiple      Live Births  2            Home Medications    Prior to Admission medications   Medication Sig Start Date End Date Taking? Authorizing Provider  ondansetron (ZOFRAN) 4 MG tablet Take 1 tablet (4 mg total) by mouth 2 (two) times daily. 08/03/20  Yes Josephmichael Lisenbee, Elita Boone, NP  levonorgestrel (MIRENA) 20 MCG/24HR IUD 1 each by Intrauterine route once. Place 12/2015    [provider]  metroNIDAZOLE (FLAGYL) 500 MG tablet Take 1 tablet (500 mg total) by mouth 2 (two) times daily. 08/03/20   Naraya Stoneberg, Elita Boone, NP  naproxen (NAPROSYN) 500 MG tablet Take 1 tablet (500 mg total) by mouth 2 (two) times daily. 10/06/19   Georgetta Haber, NP  ondansetron (ZOFRAN ODT) 4 MG disintegrating tablet Take 1 tablet (4 mg total) by mouth every 8 (eight) hours as needed for nausea or vomiting. 07/31/19   Wieters, Junius Creamer, PA-C    Family History Family History  Problem Relation Age of Onset  . Cancer Maternal Aunt   . Healthy Mother   . Anesthesia problems Neg Hx   . Hypotension Neg Hx   . Malignant hyperthermia Neg Hx   . Pseudochol deficiency Neg Hx   . Other Neg Hx     Social History Social History   Tobacco Use  . Smoking status: Former Smoker    Packs/day: 1.00    Types: Cigarettes    Quit date: 07/12/2011    Years since quitting: 9.0  . Smokeless tobacco: Never Used  Vaping Use  . Vaping Use: Never used  Substance Use Topics  . Alcohol use: Yes    Comment: occasionally  . Drug use: Yes    Types: Marijuana     Allergies   Patient has no known allergies.   Review of Systems Review of Systems  Constitutional: Negative.   Respiratory: Negative.   Cardiovascular: Negative.  Gastrointestinal: Negative.   Genitourinary: Positive for frequency and vaginal discharge. Negative for decreased urine volume, difficulty urinating, dyspareunia, dysuria, enuresis, flank pain, genital sores, hematuria, menstrual problem, pelvic pain, urgency, vaginal bleeding and vaginal pain.  Musculoskeletal: Negative.   Skin: Negative.   Neurological: Negative.      Physical Exam Triage Vital Signs ED Triage Vitals  Enc Vitals Group     BP 08/03/20 1017 114/68     Pulse Rate 08/03/20 1017 (!) 57     Resp 08/03/20 1017 16     Temp 08/03/20 1017 97.9 F (36.6 C)     Temp Source 08/03/20 1017 Oral     SpO2 08/03/20 1017 100 %     Weight --      Height --      Head Circumference --      Peak Flow --      Pain Score 08/03/20 1016 0     Pain Loc --      Pain Edu? --      Excl. in GC? --    No data found.  Updated Vital Signs BP 114/68 (BP Location: Right Arm)   Pulse (!) 57   Temp 97.9 F (36.6 C) (Oral)    Resp 16   LMP 08/03/2020 (Exact Date)   SpO2 100%   Visual Acuity Right Eye Distance:   Left Eye Distance:   Bilateral Distance:    Right Eye Near:   Left Eye Near:    Bilateral Near:     Physical Exam Constitutional:      Appearance: Normal appearance. She is normal weight.  HENT:     Head: Normocephalic.  Eyes:     Extraocular Movements: Extraocular movements intact.  Pulmonary:     Effort: Pulmonary effort is normal.  Musculoskeletal:        General: Normal range of motion.     Cervical back: Normal range of motion.  Skin:    General: Skin is warm and dry.  Neurological:     General: No focal deficit present.     Mental Status: She is alert and oriented to person, place, and time. Mental status is at baseline.  Psychiatric:        Mood and Affect: Mood normal.        Behavior: Behavior normal.        Thought Content: Thought content normal.        Judgment: Judgment normal.      UC Treatments / Results  Labs (all labs ordered are listed, but only abnormal results are displayed) Labs Reviewed - No data to display  EKG   Radiology No results found.  Procedures Procedures (including critical care time)  Medications Ordered in UC Medications - No data to display  Initial Impression / Assessment and Plan / UC Course  I have reviewed the triage vital signs and the nursing notes.  Pertinent labs & imaging results that were available during my care of the patient were reviewed by me and considered in my medical decision making (see chart for details).  Vaginal discharge  1.  Metronidazole 500 mg twice daily for the next 7 days 2.  Antibiotic makes patient sick, last time taking vomited up  several pills, Zofran 4 mg twice daily to be taken 1 hour prior to administration of antibiotics 3.  Recommended over-the-counter Dramamine 1 hour prior to taking antibiotic if unable to afford Zofran 4.  Labs pending 2 to 3 days will notify if positive,  will treat per  protocol, advised abstinence until completion of medication Final Clinical Impressions(s) / UC Diagnoses   Final diagnoses:  Vaginal discharge     Discharge Instructions     Labs pending 2 to 3 days, will be notified if positive for any other infection, please refrain from sex while labs are pending, please refrain from sex until BV treatment is complete  Take antibiotic twice a day for the next 7 days  Can use 1 Zofran pill 1 hour prior to taking antibiotic to help with nausea, if unable to get Zofran ,can take over-the-counter Dramamine located in the pharmacy section of the store to help with nausea, take Dramamine 1 hour before taking antibiotic to help with nausea  Eat before you take help avoid nausea   ED Prescriptions    Medication Sig Dispense Auth. Provider   metroNIDAZOLE (FLAGYL) 500 MG tablet Take 1 tablet (500 mg total) by mouth 2 (two) times daily. 14 tablet Jacorian Golaszewski R, NP   ondansetron (ZOFRAN) 4 MG tablet Take 1 tablet (4 mg total) by mouth 2 (two) times daily. 14 tablet Bohdi Leeds, Elita Boone, NP     PDMP not reviewed this encounter.   Valinda Hoar, NP 08/03/20 1046

## 2020-08-03 NOTE — ED Triage Notes (Signed)
Pt reports white vaginal discharge x 1 week.   

## 2020-08-03 NOTE — Discharge Instructions (Addendum)
Labs pending 2 to 3 days, will be notified if positive for any other infection, please refrain from sex while labs are pending, please refrain from sex until BV treatment is complete  Take antibiotic twice a day for the next 7 days  Can use 1 Zofran pill 1 hour prior to taking antibiotic to help with nausea, if unable to get Zofran ,can take over-the-counter Dramamine located in the pharmacy section of the store to help with nausea, take Dramamine 1 hour before taking antibiotic to help with nausea  Eat before you take help avoid nausea

## 2020-08-07 LAB — MOLECULAR ANCILLARY ONLY
Bacterial Vaginitis (gardnerella): NEGATIVE
Candida Glabrata: NEGATIVE
Candida Vaginitis: POSITIVE — AB
Chlamydia: NEGATIVE
Comment: NEGATIVE
Comment: NEGATIVE
Comment: NEGATIVE
Comment: NEGATIVE
Comment: NEGATIVE
Comment: NORMAL
Neisseria Gonorrhea: NEGATIVE
Trichomonas: NEGATIVE

## 2021-01-17 ENCOUNTER — Other Ambulatory Visit: Payer: Self-pay

## 2021-01-17 ENCOUNTER — Ambulatory Visit (HOSPITAL_COMMUNITY)
Admission: EM | Admit: 2021-01-17 | Discharge: 2021-01-17 | Disposition: A | Discharge status: Home / Self Care | Payer: Medicaid Other | Attending: Internal Medicine | Admitting: Internal Medicine

## 2021-01-17 ENCOUNTER — Encounter (HOSPITAL_COMMUNITY): Payer: Self-pay

## 2021-01-17 DIAGNOSIS — Z113 Encounter for screening for infections with a predominantly sexual mode of transmission: Secondary | ICD-10-CM | POA: Insufficient documentation

## 2021-01-17 DIAGNOSIS — R300 Dysuria: Secondary | ICD-10-CM | POA: Insufficient documentation

## 2021-01-17 LAB — POCT URINALYSIS DIPSTICK, ED / UC
Bilirubin Urine: NEGATIVE
Glucose, UA: NEGATIVE mg/dL
Ketones, ur: NEGATIVE mg/dL
Leukocytes,Ua: NEGATIVE
Nitrite: NEGATIVE
Protein, ur: NEGATIVE mg/dL
Specific Gravity, Urine: >=1.03 (ref 1.005–1.030)
Urobilinogen, UA: 0.2 mg/dL (ref 0.0–1.0)
pH: 7 (ref 5.0–8.0)

## 2021-01-17 NOTE — ED Provider Notes (Signed)
[facility]    CSN: [id number] Arrival date & time: 01/17/21  0846      History   Chief Complaint Chief Complaint  Patient presents with   Dysuria    HPI [name] is a 30 y.o. female.   Dysuria  Pain urinating started 7 days ago. Pain is: burning Medications tried: none STD exposure: possible Possibly pregnant: no, reports LMP 1-2 weeks ago  Symptoms Urgency: no Frequency: yes Blood in urine: no Pain in back:no Fever: no Vaginal discharge: no      Past Medical History:  Diagnosis Date   Anemia    Anemia    BV (bacterial vaginosis)    Chlamydia 2012    Patient Active Problem List   Diagnosis Date Noted   Hx of cesarean section complicating pregnancy 02/19/2012   Echogenic focus of heart of fetus affecting antepartum care of mother 02/19/2012   Supervision of high-risk pregnancy with insufficient prenatal care 02/19/2012   Trichomonas vaginalis (TV) infection 01/31/2012   Candida vaginitis 01/31/2012   Round ligament pain 01/31/2012   Hyperemesis gravidarum 08/14/2011    Past Surgical History:  Procedure Laterality Date   CESAREAN SECTION     TOOTH EXTRACTION  2010    OB History     Gravida  2   Para  2   Term  2   Preterm      AB      Living  2      SAB      IAB      Ectopic      Multiple      Live Births  2            Home Medications    Prior to Admission medications   Medication Sig Start Date End Date Taking? Authorizing Provider  levonorgestrel (MIRENA) 20 MCG/24HR IUD 1 each by Intrauterine route once. Place 12/2015    [provider]  metroNIDAZOLE (FLAGYL) 500 MG tablet Take 1 tablet (500 mg total) by mouth 2 (two) times daily. 08/03/20   White, Elita Boone, NP  naproxen (NAPROSYN) 500 MG tablet Take 1 tablet (500 mg total) by mouth 2 (two) times daily. 10/06/19   Georgetta Haber, NP  ondansetron (ZOFRAN ODT) 4 MG disintegrating tablet Take 1 tablet (4 mg total) by mouth every 8 (eight)  hours as needed for nausea or vomiting. 07/31/19   Wieters, Hallie C, PA-C  ondansetron (ZOFRAN) 4 MG tablet Take 1 tablet (4 mg total) by mouth 2 (two) times daily. 08/03/20   Valinda Hoar, NP    Family History Family History  Problem Relation Age of Onset   Cancer Maternal Aunt    Healthy Mother    Anesthesia problems Neg Hx    Hypotension Neg Hx    Malignant hyperthermia Neg Hx    Pseudochol deficiency Neg Hx    Other Neg Hx     Social History Social History   Tobacco Use   Smoking status: Former    Packs/day: 1.00    Types: Cigarettes    Quit date: 07/12/2011    Years since quitting: 9.5   Smokeless tobacco: Never  Vaping Use   Vaping Use: Never used  Substance Use Topics   Alcohol use: Yes    Comment: occasionally   Drug use: Yes    Types: Marijuana     Allergies   Patient has no known allergies.   Review of Systems Review of Systems  All other systems  reviewed and are negative. Per HPI  Physical Exam Triage Vital Signs ED Triage Vitals [01/17/21 0924]  Enc Vitals Group     BP 102/68     Pulse Rate (!) 57     Resp 16     Temp 97.9 F (36.6 C)     Temp Source Oral     SpO2 97 %     Weight      Height      Head Circumference      Peak Flow      Pain Score 0     Pain Loc      Pain Edu?      Excl. in GC?    No data found.  Updated Vital Signs BP 102/68 (BP Location: Right Arm)   Pulse (!) 57   Temp 97.9 F (36.6 C) (Oral)   Resp 16   SpO2 97%   Visual Acuity Right Eye Distance:   Left Eye Distance:   Bilateral Distance:    Right Eye Near:   Left Eye Near:    Bilateral Near:     Physical Exam Constitutional:      General: She is not in acute distress.    Appearance: Normal appearance. She is not ill-appearing or toxic-appearing.  HENT:     Head: Normocephalic and atraumatic.  Pulmonary:     Effort: Pulmonary effort is normal.  Abdominal:     Palpations: Abdomen is soft.     Tenderness: There is no abdominal tenderness.  There is no right CVA tenderness or left CVA tenderness.  Neurological:     Mental Status: She is alert and oriented to person, place, and time.     UC Treatments / Results  Labs (all labs ordered are listed, but only abnormal results are displayed) Labs Reviewed  POCT URINALYSIS DIPSTICK, ED / UC - Abnormal; Notable for the following components:      Result Value   Hgb urine dipstick TRACE (*)    All other components within normal limits  URINE CULTURE  CERVICOVAGINAL ANCILLARY ONLY    EKG   Radiology No results found.  Procedures Procedures (including critical care time)  Medications Ordered in UC Medications - No data to display  Initial Impression / Assessment and Plan / UC Course  I have reviewed the triage vital signs and the nursing notes.  Pertinent labs & imaging results that were available during my care of the patient were reviewed by me and considered in my medical decision making (see chart for details).     UA with trace Hg, otherwise negative, will send for culture and treat based off results.  Vaginal swab also obtained, declines HIV/RPR.  Will also treat based on results.     Final Clinical Impressions(s) / UC Diagnoses   Final diagnoses:  Dysuria  Screen for sexually transmitted diseases     Discharge Instructions      As we discussed, your urine did not show obvious signs of infection so we will send it off for a culture to be sure.  We should have the results of your vaginal swab back tomorrow, but the culture will take a few days.  We will treat you based on the results that come back.  If you have worsening of symptoms, especially if you develop fevers, you are unable to tolerate anything by mouth, you have significant back pain, you should be seen by medical provider right away.     ED Prescriptions   None  PDMP not reviewed this encounter.   Chaka Boyson, Solmon Ice, DO 01/17/21 [phone]

## 2021-01-17 NOTE — ED Triage Notes (Signed)
Pt reports burning when urinating x 1 week.   Pt requested STD's test.

## 2021-01-17 NOTE — Discharge Instructions (Signed)
As we discussed, your urine did not show obvious signs of infection so we will send it off for a culture to be sure.  We should have the results of your vaginal swab back tomorrow, but the culture will take a few days.  We will treat you based on the results that come back.  If you have worsening of symptoms, especially if you develop fevers, you are unable to tolerate anything by mouth, you have significant back pain, you should be seen by medical provider right away.

## 2021-01-18 LAB — URINE CULTURE

## 2021-01-18 LAB — CERVICOVAGINAL ANCILLARY ONLY
Bacterial Vaginitis (gardnerella): NEGATIVE
Candida Glabrata: NEGATIVE
Candida Vaginitis: NEGATIVE
Chlamydia: NEGATIVE
Comment: NEGATIVE
Comment: NEGATIVE
Comment: NEGATIVE
Comment: NEGATIVE
Comment: NEGATIVE
Comment: NORMAL
Neisseria Gonorrhea: NEGATIVE
Trichomonas: NEGATIVE

## 2021-02-28 DIAGNOSIS — Z01419 Encounter for gynecological examination (general) (routine) without abnormal findings: Secondary | ICD-10-CM | POA: Diagnosis not present

## 2021-02-28 DIAGNOSIS — Z30431 Encounter for routine checking of intrauterine contraceptive device: Secondary | ICD-10-CM | POA: Diagnosis not present

## 2021-02-28 DIAGNOSIS — Z113 Encounter for screening for infections with a predominantly sexual mode of transmission: Secondary | ICD-10-CM | POA: Diagnosis not present

## 2022-04-12 ENCOUNTER — Telehealth: Payer: Medicaid Other | Admitting: Nurse Practitioner

## 2022-04-12 DIAGNOSIS — B9689 Other specified bacterial agents as the cause of diseases classified elsewhere: Secondary | ICD-10-CM

## 2022-04-12 DIAGNOSIS — N760 Acute vaginitis: Secondary | ICD-10-CM | POA: Diagnosis not present

## 2022-04-12 MED ORDER — METRONIDAZOLE 500 MG PO TABS: 500.0000 mg | ORAL_TABLET | Freq: Two times a day (BID) | ORAL | 0 refills | Status: AC

## 2022-04-12 NOTE — Progress Notes (Signed)
Virtual Visit Consent   [name], you are scheduled for a virtual visit with a [facility] provider today. Just as with appointments in the office, your consent must be obtained to participate. Your consent will be active for this visit and any virtual visit you may have with one of our providers in the next 365 days. If you have a MyChart account, a copy of this consent can be sent to you electronically.  As this is a virtual visit, video technology does not allow for your provider to perform a traditional examination. This may limit your provider's ability to fully assess your condition. If your provider identifies any concerns that need to be evaluated in person or the need to arrange testing (such as labs, EKG, etc.), we will make arrangements to do so. Although advances in technology are sophisticated, we cannot ensure that it will always work on either your end or our end. If the connection with a video visit is poor, the visit may have to be switched to a telephone visit. With either a video or telephone visit, we are not always able to ensure that we have a secure connection.  By engaging in this virtual visit, you consent to the provision of healthcare and authorize for your insurance to be billed (if applicable) for the services provided during this visit. Depending on your insurance coverage, you may receive a charge related to this service.  I need to obtain your verbal consent now. Are you willing to proceed with your visit today? [name] has provided verbal consent on 04/12/2022 for a virtual visit (video or telephone). Apolonio Schneiders, FNP  Date: 04/12/2022 10:01 AM  Virtual Visit via Video Note   I, Apolonio Schneiders, connected with  [name]  ([id number], 1990/05/28) on 04/12/22 at 10:00 AM EST by a video-enabled telemedicine application and verified that I am speaking with the correct person using two identifiers.  Location: Patient: Virtual Visit Location Patient:  Home Provider: Virtual Visit Location Provider: Home Office   I discussed the limitations of evaluation and management by telemedicine and the availability of in person appointments. The patient expressed understanding and agreed to proceed.    History of Present Illness: [name] is a 32 y.o. who identifies as a female who was assigned female at birth, and is being seen today for urinary frequency and urgency without pain.   She has had some lower back pain  She has had vaginal discharge that is green with odor  Denies pain with urinating but feels discomfort after urinating   She denies any new sexual partners in the past 2 weeks  Had menstraul cycle 04/06/22  She has had BV in the past feels similar  She is not pregnant or breastfeeding   Problems:  Patient Active Problem List   Diagnosis Date Noted   Hx of cesarean section complicating pregnancy 47/11/6281   Echogenic focus of heart of fetus affecting antepartum care of mother 02/19/2012   Supervision of high-risk pregnancy with insufficient prenatal care 02/19/2012   Trichomonas vaginalis (TV) infection 01/31/2012   Candida vaginitis 01/31/2012   Round ligament pain 01/31/2012   Hyperemesis gravidarum 08/14/2011    Allergies: No Known Allergies Medications: None    Observations/Objective: Patient is well-developed, well-nourished in no acute distress.  Resting comfortably  at home.  Head is normocephalic, atraumatic.  No labored breathing.  Speech is clear and coherent with logical content.  Patient is alert and oriented at baseline.    Assessment and  Plan: 1. Bacterial vaginitis  - metroNIDAZOLE (FLAGYL) 500 MG tablet; Take 1 tablet (500 mg total) by mouth 2 (two) times daily for 7 days.  Dispense: 14 tablet; Refill: 0 No alcohol with antibiotic  If symptoms persist patient was instructed to get follow up for STI testing      Follow Up Instructions: I discussed the assessment and treatment plan with the  patient. The patient was provided an opportunity to ask questions and all were answered. The patient agreed with the plan and demonstrated an understanding of the instructions.  A copy of instructions were sent to the patient via MyChart unless otherwise noted below.    The patient was advised to call back or seek an in-person evaluation if the symptoms worsen or if the condition fails to improve as anticipated.  Time:  I spent 7 minutes with the patient via telehealth technology discussing the above problems/concerns.    Apolonio Schneiders, FNP

## 2022-06-21 ENCOUNTER — Telehealth: Payer: Medicaid Other | Admitting: Nurse Practitioner

## 2022-06-21 ENCOUNTER — Ambulatory Visit (HOSPITAL_COMMUNITY): Payer: Self-pay

## 2022-06-21 DIAGNOSIS — B354 Tinea corporis: Secondary | ICD-10-CM

## 2022-06-21 MED ORDER — FLUCONAZOLE 150 MG PO TABS: ORAL_TABLET | ORAL | 0 refills | Status: DC

## 2022-06-21 NOTE — Progress Notes (Signed)
Virtual Visit Consent   [name], you are scheduled for a virtual visit with a [facility] provider today. Just as with appointments in the office, your consent must be obtained to participate. Your consent will be active for this visit and any virtual visit you may have with one of our providers in the next 365 days. If you have a MyChart account, a copy of this consent can be sent to you electronically.  As this is a virtual visit, video technology does not allow for your provider to perform a traditional examination. This may limit your provider's ability to fully assess your condition. If your provider identifies any concerns that need to be evaluated in person or the need to arrange testing (such as labs, EKG, etc.), we will make arrangements to do so. Although advances in technology are sophisticated, we cannot ensure that it will always work on either your end or our end. If the connection with a video visit is poor, the visit may have to be switched to a telephone visit. With either a video or telephone visit, we are not always able to ensure that we have a secure connection.  By engaging in this virtual visit, you consent to the provision of healthcare and authorize for your insurance to be billed (if applicable) for the services provided during this visit. Depending on your insurance coverage, you may receive a charge related to this service.  I need to obtain your verbal consent now. Are you willing to proceed with your visit today? [name] has provided verbal consent on 06/21/2022 for a virtual visit (video or telephone). Apolonio Schneiders, FNP  Date: 06/21/2022 1:41 PM  Virtual Visit via Video Note   I, Apolonio Schneiders, connected with  [name]  ([id number], [birth year]) on 06/21/22 at  1:45 PM EDT by a video-enabled telemedicine application and verified that I am speaking with the correct person using two identifiers.  Location: Patient: Virtual Visit Location Patient:  Home Provider: Virtual Visit Location Provider: Home Office   I discussed the limitations of evaluation and management by telemedicine and the availability of in person appointments. The patient expressed understanding and agreed to proceed.    History of Present Illness: [name] is a 32 y.o. who identifies as a female who was assigned female at birth, and is being seen today for ringworm.  She has been using OTC lotrimin for the past 2 weeks without relief.   She has a spot on her arm and 2 spots on her back.  The areas itch and get more irritated when she uses lotions    She was treated for BV in January with oral Flagyl  Symptom onset was a month after that antibiotic though   Denies eczema history   Problems:  Patient Active Problem List   Diagnosis Date Noted   Hx of cesarean section complicating pregnancy [id number]   Echogenic focus of heart of fetus affecting antepartum care of mother 02/19/2012   Supervision of high-risk pregnancy with insufficient prenatal care 02/19/2012   Trichomonas vaginalis (TV) infection 01/31/2012   Candida vaginitis 01/31/2012   Round ligament pain 01/31/2012   Hyperemesis gravidarum 08/14/2011    Allergies: No Known Allergies Medications: No current outpatient medications on file.  Observations/Objective: Patient is well-developed, well-nourished in no acute distress.  Resting comfortably  at home.  Head is normocephalic, atraumatic.  No labored breathing.  Speech is clear and coherent with logical content.  Patient is alert and oriented at baseline.  Assessment and Plan: 1. Tinea corporis  Advised oral probiotic daily  Avoid  scented soaps and lotions   - fluconazole (DIFLUCAN) 150 MG tablet; Take one tablet every 72 hours X3  Dispense: 3 tablet; Refill: 0     Follow Up Instructions: I discussed the assessment and treatment plan with the patient. The patient was provided an opportunity to ask questions and all were  answered. The patient agreed with the plan and demonstrated an understanding of the instructions.  A copy of instructions were sent to the patient via MyChart unless otherwise noted below.    The patient was advised to call back or seek an in-person evaluation if the symptoms worsen or if the condition fails to improve as anticipated.  Time:  I spent 15 minutes with the patient via telehealth technology discussing the above problems/concerns.    Apolonio Schneiders, FNP

## 2022-06-26 ENCOUNTER — Telehealth: Payer: Medicaid Other | Admitting: Physician Assistant

## 2022-06-26 DIAGNOSIS — H6991 Unspecified Eustachian tube disorder, right ear: Secondary | ICD-10-CM | POA: Diagnosis not present

## 2022-06-26 DIAGNOSIS — H66001 Acute suppurative otitis media without spontaneous rupture of ear drum, right ear: Secondary | ICD-10-CM

## 2022-06-26 MED ORDER — AZITHROMYCIN 250 MG PO TABS: ORAL_TABLET | ORAL | 0 refills | Status: AC

## 2022-06-26 MED ORDER — FLUTICASONE PROPIONATE 50 MCG/ACT NA SUSP: 2.0000 | Freq: Every day | NASAL | 0 refills | Status: DC

## 2022-06-26 NOTE — Progress Notes (Signed)
Virtual Visit Consent   [name], you are scheduled for a virtual visit with a [facility] provider today. Just as with appointments in the office, your consent must be obtained to participate. Your consent will be active for this visit and any virtual visit you may have with one of our providers in the next 365 days. If you have a MyChart account, a copy of this consent can be sent to you electronically.  As this is a virtual visit, video technology does not allow for your provider to perform a traditional examination. This may limit your provider's ability to fully assess your condition. If your provider identifies any concerns that need to be evaluated in person or the need to arrange testing (such as labs, EKG, etc.), we will make arrangements to do so. Although advances in technology are sophisticated, we cannot ensure that it will always work on either your end or our end. If the connection with a video visit is poor, the visit may have to be switched to a telephone visit. With either a video or telephone visit, we are not always able to ensure that we have a secure connection.  By engaging in this virtual visit, you consent to the provision of healthcare and authorize for your insurance to be billed (if applicable) for the services provided during this visit. Depending on your insurance coverage, you may receive a charge related to this service.  I need to obtain your verbal consent now. Are you willing to proceed with your visit today? [name] has provided verbal consent on 06/26/2022 for a virtual visit (video or telephone). Leeanne Rio, [place]  Date: 06/26/2022 9:50 AM  Virtual Visit via Video Note   I, Leeanne Rio, connected with  [name]  ([id number], 05/18/1990) on 06/26/22 at  9:45 AM EDT by a video-enabled telemedicine application and verified that I am speaking with the correct person using two identifiers.  Location: Patient: Virtual Visit Location  Patient: Home Provider: Virtual Visit Location Provider: Home Office   I discussed the limitations of evaluation and management by telemedicine and the availability of in person appointments. The patient expressed understanding and agreed to proceed.    History of Present Illness: [name] is a 32 y.o. who identifies as a female who was assigned female at birth, and is being seen today for several days of URI symptoms with nasal and head congestion, sinus pressure and ear pressure/clogging. Denies fever, chills, aches. R ear with pain now. Some mild decreased hearing. Denies drainage. Denies tinnitus or dizziness. Denies symptoms of L ear.   OTC -- Tylenol.   HPI: HPI  Problems:  Patient Active Problem List   Diagnosis Date Noted   Hx of cesarean section complicating pregnancy [id number]   Echogenic focus of heart of fetus affecting antepartum care of mother 02/19/2012   Supervision of high-risk pregnancy with insufficient prenatal care 02/19/2012   Trichomonas vaginalis (TV) infection 01/31/2012   Candida vaginitis 01/31/2012   Round ligament pain 01/31/2012   Hyperemesis gravidarum 08/14/2011    Allergies: No Known Allergies Medications:  Current Outpatient Medications:    azithromycin (ZITHROMAX) 250 MG tablet, Take 2 tablets on day 1, then 1 tablet daily on days 2 through 5, Disp: 6 tablet, Rfl: 0   fluticasone (FLONASE) 50 MCG/ACT nasal spray, Place 2 sprays into both nostrils daily., Disp: 16 g, Rfl: 0   fluconazole (DIFLUCAN) 150 MG tablet, Take one tablet every 72 hours X3, Disp: 3 tablet, Rfl: 0  Observations/Objective: Patient is well-developed, well-nourished in no acute distress.  Resting comfortably at home.  Head is normocephalic, atraumatic.  No labored breathing. Speech is clear and coherent with logical content.  Patient is alert and oriented at baseline.   Assessment and Plan: 1. Non-recurrent acute suppurative otitis media of right ear without spontaneous  rupture of tympanic membrane - azithromycin (ZITHROMAX) 250 MG tablet; Take 2 tablets on day 1, then 1 tablet daily on days 2 through 5  Dispense: 6 tablet; Refill: 0 - fluticasone (FLONASE) 50 MCG/ACT nasal spray; Place 2 sprays into both nostrils daily.  Dispense: 16 g; Refill: 0  2. Dysfunction of right eustachian tube  Supportive measures and OTC medications reviewed.  Recommend Mucinex sinus max over-the-counter.  Start Flonase as directed.  Azithromycin per orders.  Follow-up in person if symptoms or not resolving.  Follow Up Instructions: I discussed the assessment and treatment plan with the patient. The patient was provided an opportunity to ask questions and all were answered. The patient agreed with the plan and demonstrated an understanding of the instructions.  A copy of instructions were sent to the patient via MyChart unless otherwise noted below.   The patient was advised to call back or seek an in-person evaluation if the symptoms worsen or if the condition fails to improve as anticipated.  Time:  I spent 10 minutes with the patient via telehealth technology discussing the above problems/concerns.    Leeanne Rio, PA-C

## 2022-06-26 NOTE — Patient Instructions (Signed)
  Herb Grays, thank you for joining Leeanne Rio, PA-C for today's virtual visit.  While this provider is not your primary care provider (PCP), if your PCP is located in our provider database this encounter information will be shared with them immediately following your visit.   [facility] account gives you access to today's visit and all your visits, tests, and labs performed at [facility] " click here if you don't have a [facility] account or go to [email or web address]  Consent: (Patient) [name] provided verbal consent for this virtual visit at the beginning of the encounter.  Current Medications:  Current Outpatient Medications:    fluconazole (DIFLUCAN) 150 MG tablet, Take one tablet every 72 hours X3, Disp: 3 tablet, Rfl: 0   Medications ordered in this encounter:  No orders of the defined types were placed in this encounter.    *If you need refills on other medications prior to your next appointment, please contact your pharmacy*  Follow-Up: Call back or seek an in-person evaluation if the symptoms worsen or if the condition fails to improve as anticipated.  [facility] [phone]  Other Instructions Please keep well-hydrated and get plenty of rest. Start over-the-counter Mucinex sinus max with Claritin-D over-the-counter. Use the Flonase once daily as directed. Take the antibiotic as directed. If symptoms or not resolving or you note any new or worsening symptoms despite treatment, please seek an in person evaluation.   If you have been instructed to have an in-person evaluation today at a local Urgent Care facility, please use the link below. It will take you to a list of all of our available [facility], including address, phone number and hours of operation. Please do not delay care.  [facility]  If you or a family member do not have a primary care provider, use the link  below to schedule a visit and establish care. When you choose a [facility] primary care physician or advanced practice provider, you gain a long-term partner in health. Find a Primary Care Provider  Learn more about 's in-office and virtual care options: [facility]

## 2022-07-22 ENCOUNTER — Telehealth: Payer: Medicaid Other | Admitting: Nurse Practitioner

## 2022-07-22 DIAGNOSIS — N898 Other specified noninflammatory disorders of vagina: Secondary | ICD-10-CM

## 2022-07-22 NOTE — Progress Notes (Signed)
[name],  Because you are describing symptoms of a UTI, BV and a yeast infection, I feel your condition warrants further evaluation and I recommend that you be seen for a face to face visit.  Please contact your primary care physician practice to be seen. Many offices offer virtual options to be seen via video if you are not comfortable going in person to a medical facility at this time.  NOTE: You will NOT be charged for this eVisit.  If you do not have a PCP, [facility] offers a free physician referral service available at [phone]. Our trained staff has the experience, knowledge and resources to put you in touch with a physician who is right for you.    If you are having a true medical emergency please call 911.   Your e-visit answers were reviewed by a board certified advanced clinical practitioner to complete your personal care plan.  Thank you for using e-Visits.

## 2022-07-23 ENCOUNTER — Ambulatory Visit (HOSPITAL_COMMUNITY)
Admission: EM | Admit: 2022-07-23 | Discharge: 2022-07-23 | Disposition: A | Discharge status: Home / Self Care | Payer: Medicaid Other | Attending: Internal Medicine | Admitting: Internal Medicine

## 2022-07-23 ENCOUNTER — Encounter (HOSPITAL_COMMUNITY): Payer: Self-pay

## 2022-07-23 DIAGNOSIS — N760 Acute vaginitis: Secondary | ICD-10-CM | POA: Diagnosis not present

## 2022-07-23 DIAGNOSIS — N898 Other specified noninflammatory disorders of vagina: Secondary | ICD-10-CM | POA: Diagnosis present

## 2022-07-23 MED ORDER — METRONIDAZOLE 500 MG PO TABS: 500.0000 mg | ORAL_TABLET | Freq: Two times a day (BID) | ORAL | 0 refills | Status: DC

## 2022-07-23 NOTE — ED Provider Notes (Signed)
[facility]    CSN: [id number] Arrival date & time: 07/23/22  0840      History   Chief Complaint Chief Complaint  Patient presents with   Vaginal Discharge    HPI [name] is a 32 y.o. female.   Patient presents to urgent care for evaluation of thin white vaginal discharge that has a foul smell for the last 1 to 2 weeks.  History of BV, believes this may be the same.  She is sexually active with female partner.  No known exposure to STD.  Currently on her withdrawal bleed, has IUD.  No urinary symptoms, vaginal itching, rash, fever/chills, nausea, vomiting, flank pain, abdominal pain, or vaginal rash.  No recent antibiotic or steroid use.  She was recently treated for BV in January 2024.  She is not diabetic.   Vaginal Discharge   Past Medical History:  Diagnosis Date   Anemia    Anemia    BV (bacterial vaginosis)    Chlamydia 2012    Patient Active Problem List   Diagnosis Date Noted   Hx of cesarean section complicating pregnancy 02/19/2012   Echogenic focus of heart of fetus affecting antepartum care of mother 02/19/2012   Supervision of high-risk pregnancy with insufficient prenatal care 02/19/2012   Trichomonas vaginalis (TV) infection 01/31/2012   Candida vaginitis 01/31/2012   Round ligament pain 01/31/2012   Hyperemesis gravidarum 08/14/2011    Past Surgical History:  Procedure Laterality Date   CESAREAN SECTION     TOOTH EXTRACTION  2010    OB History     Gravida  2   Para  2   Term  2   Preterm      AB      Living  2      SAB      IAB      Ectopic      Multiple      Live Births  2            Home Medications    Prior to Admission medications   Medication Sig Start Date End Date Taking? Authorizing Provider  metroNIDAZOLE (FLAGYL) 500 MG tablet Take 1 tablet (500 mg total) by mouth 2 (two) times daily. 07/23/22  Yes Carlisle Beers, FNP  fluconazole (DIFLUCAN) 150 MG tablet Take one tablet every 72  hours X3 06/21/22   Viviano Simas, FNP  fluticasone [facility]) 50 MCG/ACT nasal spray Place 2 sprays into both nostrils daily. 06/26/22   Waldon Merl, PA-C    Family History Family History  Problem Relation Age of Onset   Cancer Maternal Aunt    Healthy Mother    Anesthesia problems Neg Hx    Hypotension Neg Hx    Malignant hyperthermia Neg Hx    Pseudochol deficiency Neg Hx    Other Neg Hx     Social History Social History   Tobacco Use   Smoking status: Former    Packs/day: 1    Types: Cigarettes    Quit date: 07/12/2011    Years since quitting: 11.0   Smokeless tobacco: Never  Vaping Use   Vaping Use: Never used  Substance Use Topics   Alcohol use: Yes    Comment: occasionally   Drug use: Yes    Types: Marijuana     Allergies   Patient has no known allergies.   Review of Systems Review of Systems  Genitourinary:  Positive for vaginal discharge.     Physical Exam  Triage Vital Signs ED Triage Vitals  Enc Vitals Group     BP 07/23/22 0920 98/66     Pulse Rate 07/23/22 0920 62     Resp 07/23/22 0920 16     Temp 07/23/22 0920 97.8 F (36.6 C)     Temp Source 07/23/22 0920 Oral     SpO2 07/23/22 0920 96 %     Weight --      Height --      Head Circumference --      Peak Flow --      Pain Score 07/23/22 0921 0     Pain Loc --      Pain Edu? --      Excl. in GC? --    No data found.  Updated Vital Signs BP 98/66 (BP Location: Right Arm)   Pulse 62   Temp 97.8 F (36.6 C) (Oral)   Resp 16   LMP 07/23/2022   SpO2 96%   Visual Acuity Right Eye Distance:   Left Eye Distance:   Bilateral Distance:    Right Eye Near:   Left Eye Near:    Bilateral Near:     Physical Exam Vitals and nursing note reviewed.  Constitutional:      Appearance: She is not ill-appearing or toxic-appearing.  HENT:     Head: Normocephalic and atraumatic.     Right Ear: Hearing and external ear normal.     Left Ear: Hearing and external ear normal.     Nose:  Nose normal.     Mouth/Throat:     Lips: Pink.  Eyes:     General: Lids are normal. Vision grossly intact. Gaze aligned appropriately.     Extraocular Movements: Extraocular movements intact.     Conjunctiva/sclera: Conjunctivae normal.  Pulmonary:     Effort: Pulmonary effort is normal.  Genitourinary:    Comments: Deferred. Musculoskeletal:     Cervical back: Neck supple.  Skin:    General: Skin is warm and dry.     Capillary Refill: Capillary refill takes less than 2 seconds.     Findings: No rash.  Neurological:     General: No focal deficit present.     Mental Status: She is alert and oriented to person, place, and time. Mental status is at baseline.     Cranial Nerves: No dysarthria or facial asymmetry.  Psychiatric:        Mood and Affect: Mood normal.        Speech: Speech normal.        Behavior: Behavior normal.        Thought Content: Thought content normal.        Judgment: Judgment normal.      UC Treatments / Results  Labs (all labs ordered are listed, but only abnormal results are displayed) Labs Reviewed  CERVICOVAGINAL ANCILLARY ONLY    EKG   Radiology No results found.  Procedures Procedures (including critical care time)  Medications Ordered in UC Medications - No data to display  Initial Impression / Assessment and Plan / UC Course  I have reviewed the triage vital signs and the nursing notes.  Pertinent labs & imaging results that were available during my care of the patient were reviewed by me and considered in my medical decision making (see chart for details).   1.  Acute vaginitis, vaginal discharge STI labs pending, patient declines HIV and/or syphilis testing today. Will go ahead and start treatment for BV based on  symptoms and history of same.  Flagyl sent to pharmacy to be taken as prescribed.  Discussed abstinence from alcohol during course of antibiotics and for at least 72 hours after antibiotic use due to adverse reaction with  alcohol and Flagyl.  Will treat for all other positive results once STI labs result. Patient to abstain from sexual intercourse for 7 days while undergoing treatment. Education provided regarding safe sexual practices and patient encouraged to use protection to prevent spread of STIs.   Discussed physical exam and available lab work findings in clinic with patient.  Counseled patient regarding appropriate use of medications and potential side effects for all medications recommended or prescribed today. Discussed red flag signs and symptoms of worsening condition,when to call the PCP office, return to urgent care, and when to seek higher level of care in the emergency department. Patient verbalizes understanding and agreement with plan. All questions answered. Patient discharged in stable condition.    Final Clinical Impressions(s) / UC Diagnoses   Final diagnoses:  Acute vaginitis  Vaginal discharge     Discharge Instructions      You were tested today for STDs and the results are still pending; you have been given treatment for possible infections presumptively anyway due to your symptoms. You will receive a phone call in approximately 3 days if the results are positive. You should follow up with your primary care provider for further STI testing.  Avoid sexual intercourse for 7 days. Advise your sexual partner(s) to be evaluated, tested and treated. This includes all sexual partners within the past 60 days or your last sexual partner if last contact was greater than 60 days.  To minimize the risk of reinfection, you should abstain from sexual intercourse until your sexual partners have been tested and treated.   Consistent condom use is important in preventing the spread of sexually transmitted infections.  We will treat for any other positive results when your labs come back. If you do not hear from [place], this means that your STI testing was negative or there is no change to your treatment  plan. You will also receive these results via MyChart.   Return if you experience fevers 100.4 or greater, worsening or uncontrolled pain, rashes, sores, vomiting, or for any other concerning symptoms.       ED Prescriptions     Medication Sig Dispense Auth. Provider   metroNIDAZOLE (FLAGYL) 500 MG tablet Take 1 tablet (500 mg total) by mouth 2 (two) times daily. 14 tablet Carlisle Beers, FNP      PDMP not reviewed this encounter.   Carlisle Beers, [place] 07/23/22 1008

## 2022-07-23 NOTE — ED Triage Notes (Signed)
Pt states white vaginal discharge and odor for the past 2 weeks.

## 2022-07-23 NOTE — Discharge Instructions (Signed)
You were tested today for STDs and the results are still pending; you have been given treatment for possible infections presumptively anyway due to your symptoms. You will receive a phone call in approximately 3 days if the results are positive. You should follow up with your primary care provider for further STI testing.  Avoid sexual intercourse for 7 days. Advise your sexual partner(s) to be evaluated, tested and treated. This includes all sexual partners within the past 60 days or your last sexual partner if last contact was greater than 60 days.  To minimize the risk of reinfection, you should abstain from sexual intercourse until your sexual partners have been tested and treated.   Consistent condom use is important in preventing the spread of sexually transmitted infections.  We will treat for any other positive results when your labs come back. If you do not hear from us, this means that your STI testing was negative or there is no change to your treatment plan. You will also receive these results via MyChart.   Return if you experience fevers 100.4 or greater, worsening or uncontrolled pain, rashes, sores, vomiting, or for any other concerning symptoms.   

## 2022-07-24 LAB — CERVICOVAGINAL ANCILLARY ONLY
Bacterial Vaginitis (gardnerella): POSITIVE — AB
Candida Glabrata: NEGATIVE
Candida Vaginitis: NEGATIVE
Chlamydia: NEGATIVE
Comment: NEGATIVE
Comment: NEGATIVE
Comment: NEGATIVE
Comment: NEGATIVE
Comment: NEGATIVE
Comment: NORMAL
Neisseria Gonorrhea: NEGATIVE
Trichomonas: NEGATIVE

## 2022-08-14 ENCOUNTER — Telehealth: Payer: Medicaid Other | Admitting: Nurse Practitioner

## 2022-08-14 DIAGNOSIS — R6889 Other general symptoms and signs: Secondary | ICD-10-CM

## 2022-08-14 NOTE — Progress Notes (Signed)
Virtual Visit Consent   [name], you are scheduled for a virtual visit with a [facility] provider today. Just as with appointments in the office, your consent must be obtained to participate. Your consent will be active for this visit and any virtual visit you may have with one of our providers in the next 365 days. If you have a MyChart account, a copy of this consent can be sent to you electronically.  As this is a virtual visit, video technology does not allow for your provider to perform a traditional examination. This may limit your provider's ability to fully assess your condition. If your provider identifies any concerns that need to be evaluated in person or the need to arrange testing (such as labs, EKG, etc.), we will make arrangements to do so. Although advances in technology are sophisticated, we cannot ensure that it will always work on either your end or our end. If the connection with a video visit is poor, the visit may have to be switched to a telephone visit. With either a video or telephone visit, we are not always able to ensure that we have a secure connection.  By engaging in this virtual visit, you consent to the provision of healthcare and authorize for your insurance to be billed (if applicable) for the services provided during this visit. Depending on your insurance coverage, you may receive a charge related to this service.  I need to obtain your verbal consent now. Are you willing to proceed with your visit today? [name] has provided verbal consent on 08/14/2022 for a virtual visit (video or telephone). Viviano Simas, FNP  Date: 08/14/2022 12:01 PM  Virtual Visit via Video Note   I, Viviano Simas, connected with  [name]  ([id number], May 01, 1990) on 08/14/22 at 12:00 PM EDT by a video-enabled telemedicine application and verified that I am speaking with the correct person using two identifiers.  Location: Patient: Virtual Visit Location Patient:  Home Provider: Virtual Visit Location Provider: Home Office   I discussed the limitations of evaluation and management by telemedicine and the availability of in person appointments. The patient expressed understanding and agreed to proceed.    History of Present Illness: [name] is a 32 y.o. who identifies as a female who was assigned female at birth, and is being seen today with complaints of intolerance to cold air related to going into the freezer at her work.   She is requesting a note that states she cannot go into the cooler at work.   She works at a [employer] and enjoys her job  She does not have a primary care provider at the time  Has not had recent blood work but does have a history of anemia   Feels when she enters the cold air her joints hurt and she feels ill   Problems:  Patient Active Problem List   Diagnosis Date Noted   Hx of cesarean section complicating pregnancy 02/19/2012   Echogenic focus of heart of fetus affecting antepartum care of mother 02/19/2012   Supervision of high-risk pregnancy with insufficient prenatal care 02/19/2012   Trichomonas vaginalis (TV) infection 01/31/2012   Candida vaginitis 01/31/2012   Round ligament pain 01/31/2012   Hyperemesis gravidarum 08/14/2011    Allergies: No Known Allergies Medications:  Current Outpatient Medications:    fluconazole (DIFLUCAN) 150 MG tablet, Take one tablet every 72 hours X3, Disp: 3 tablet, Rfl: 0   fluticasone (FLONASE) 50 MCG/ACT nasal spray, Place 2 sprays  into both nostrils daily., Disp: 16 g, Rfl: 0   metroNIDAZOLE (FLAGYL) 500 MG tablet, Take 1 tablet (500 mg total) by mouth 2 (two) times daily., Disp: 14 tablet, Rfl: 0  Observations/Objective: Patient is well-developed, well-nourished in no acute distress.  Resting comfortably  at home.  Head is normocephalic, atraumatic.  No labored breathing.  Speech is clear and coherent with logical content.  Patient is alert and oriented at  baseline.    Assessment and Plan: 1. Cold intolerance Discussed with patient we advise she establish with a PCP for physical.labs and discussion on underlying conditions that may contribute to cold intolerance  Patient is scheduled with PCP next week  Provided work not to excuse for that appointment      Follow Up Instructions: I discussed the assessment and treatment plan with the patient. The patient was provided an opportunity to ask questions and all were answered. The patient agreed with the plan and demonstrated an understanding of the instructions.  A copy of instructions were sent to the patient via MyChart unless otherwise noted below.    The patient was advised to call back or seek an in-person evaluation if the symptoms worsen or if the condition fails to improve as anticipated.  Time:  I spent 15 minutes with the patient via telehealth technology discussing the above problems/concerns.    Viviano Simas, FNP

## 2022-08-14 NOTE — Patient Instructions (Signed)
New Patient Visit with Chaya Jan Monday Aug 19, 2022 Arrive by 10:15 AM EDT Starts at 10:30 AM EDT (30 minutes) Add to calendar [facility] [address] [place] [address] [phone]

## 2022-08-19 ENCOUNTER — Ambulatory Visit: Payer: Medicaid Other | Admitting: Internal Medicine

## 2022-09-09 ENCOUNTER — Telehealth: Payer: Medicaid Other | Admitting: Physician Assistant

## 2022-09-09 DIAGNOSIS — B9689 Other specified bacterial agents as the cause of diseases classified elsewhere: Secondary | ICD-10-CM | POA: Diagnosis not present

## 2022-09-09 DIAGNOSIS — N760 Acute vaginitis: Secondary | ICD-10-CM | POA: Diagnosis not present

## 2022-09-09 MED ORDER — METRONIDAZOLE 500 MG PO TABS: 500.0000 mg | ORAL_TABLET | Freq: Two times a day (BID) | ORAL | 0 refills | Status: DC

## 2022-09-09 NOTE — Progress Notes (Signed)
E-Visit for Vaginal Symptoms  We are sorry that you are not feeling well. Here is how we plan to help! Based on what you shared with me it looks like you: May have a vaginosis due to bacteria  Vaginosis is an inflammation of the vagina that can result in discharge, itching and pain. The cause is usually a change in the normal balance of vaginal bacteria or an infection. Vaginosis can also result from reduced estrogen levels after menopause.  The most common causes of vaginosis are:   Bacterial vaginosis which results from an overgrowth of one on several organisms that are normally present in your vagina.   Yeast infections which are caused by a naturally occurring fungus called candida.   Vaginal atrophy (atrophic vaginosis) which results from the thinning of the vagina from reduced estrogen levels after menopause.   Trichomoniasis which is caused by a parasite and is commonly transmitted by sexual intercourse.  Factors that increase your risk of developing vaginosis include: Medications, such as antibiotics and steroids Uncontrolled diabetes Use of hygiene products such as bubble bath, vaginal spray or vaginal deodorant Douching Wearing damp or tight-fitting clothing Using an intrauterine device (IUD) for birth control Hormonal changes, such as those associated with pregnancy, birth control pills or menopause Sexual activity Having a sexually transmitted infection  Your treatment plan is Metronidazole or Flagyl 500mg twice a day for 7 days.  I have electronically sent this prescription into the pharmacy that you have chosen.  Be sure to take all of the medication as directed. Stop taking any medication if you develop a rash, tongue swelling or shortness of breath. Mothers who are breast feeding should consider pumping and discarding their breast milk while on these antibiotics. However, there is no consensus that infant exposure at these doses would be harmful.  Remember that  medication creams can weaken latex condoms. .   HOME CARE:  Good hygiene may prevent some types of vaginosis from recurring and may relieve some symptoms:  Avoid baths, hot tubs and whirlpool spas. Rinse soap from your outer genital area after a shower, and dry the area well to prevent irritation. Don't use scented or harsh soaps, such as those with deodorant or antibacterial action. Avoid irritants. These include scented tampons and pads. Wipe from front to back after using the toilet. Doing so avoids spreading fecal bacteria to your vagina.  Other things that may help prevent vaginosis include:  Don't douche. Your vagina doesn't require cleansing other than normal bathing. Repetitive douching disrupts the normal organisms that reside in the vagina and can actually increase your risk of vaginal infection. Douching won't clear up a vaginal infection. Use a latex condom. Both female and female latex condoms may help you avoid infections spread by sexual contact. Wear cotton underwear. Also wear pantyhose with a cotton crotch. If you feel comfortable without it, skip wearing underwear to bed. Yeast thrives in moist environments Your symptoms should improve in the next day or two.  GET HELP RIGHT AWAY IF:  You have pain in your lower abdomen ( pelvic area or over your ovaries) You develop nausea or vomiting You develop a fever Your discharge changes or worsens You have persistent pain with intercourse You develop shortness of breath, a rapid pulse, or you faint.  These symptoms could be signs of problems or infections that need to be evaluated by a medical provider now.  MAKE SURE YOU   Understand these instructions. Will watch your condition. Will get help right   away if you are not doing well or get worse.  Thank you for choosing an e-visit.  Your e-visit answers were reviewed by a board certified advanced clinical practitioner to complete your personal care plan. Depending upon the  condition, your plan could have included both over the counter or prescription medications.  Please review your pharmacy choice. Make sure the pharmacy is open so you can pick up prescription now. If there is a problem, you may contact your provider through MyChart messaging and have the prescription routed to another pharmacy.  Your safety is important to us. If you have drug allergies check your prescription carefully.   For the next 24 hours you can use MyChart to ask questions about today's visit, request a non-urgent call back, or ask for a work or school excuse. You will get an email in the next two days asking about your experience. I hope that your e-visit has been valuable and will speed your recovery.  I have spent 5 minutes in review of e-visit questionnaire, review and updating patient chart, medical decision making and response to patient.   Kaitelyn Jamison M Shakayla Hickox, PA-C  

## 2022-11-11 ENCOUNTER — Other Ambulatory Visit (INDEPENDENT_AMBULATORY_CARE_PROVIDER_SITE_OTHER): Payer: Self-pay | Admitting: Physician Assistant

## 2022-11-11 ENCOUNTER — Telehealth: Payer: Medicaid Other | Admitting: Physician Assistant

## 2022-11-11 DIAGNOSIS — B9689 Other specified bacterial agents as the cause of diseases classified elsewhere: Secondary | ICD-10-CM | POA: Diagnosis not present

## 2022-11-11 DIAGNOSIS — N760 Acute vaginitis: Secondary | ICD-10-CM

## 2022-11-11 MED ORDER — METRONIDAZOLE 500 MG PO TABS: 500.0000 mg | ORAL_TABLET | Freq: Two times a day (BID) | ORAL | 0 refills | Status: AC

## 2022-11-11 NOTE — Progress Notes (Signed)
E-Visit for Vaginal Symptoms  We are sorry that you are not feeling well. Here is how we plan to help! Based on what you shared with me it looks like you: May have a vaginosis due to bacteria  Vaginosis is an inflammation of the vagina that can result in discharge, itching and pain. The cause is usually a change in the normal balance of vaginal bacteria or an infection. Vaginosis can also result from reduced estrogen levels after menopause.  The most common causes of vaginosis are:   Bacterial vaginosis which results from an overgrowth of one on several organisms that are normally present in your vagina.   Yeast infections which are caused by a naturally occurring fungus called candida.   Vaginal atrophy (atrophic vaginosis) which results from the thinning of the vagina from reduced estrogen levels after menopause.   Trichomoniasis which is caused by a parasite and is commonly transmitted by sexual intercourse.  Factors that increase your risk of developing vaginosis include: Medications, such as antibiotics and steroids Uncontrolled diabetes Use of hygiene products such as bubble bath, vaginal spray or vaginal deodorant Douching Wearing damp or tight-fitting clothing Using an intrauterine device (IUD) for birth control Hormonal changes, such as those associated with pregnancy, birth control pills or menopause Sexual activity Having a sexually transmitted infection  Your treatment plan is Metronidazole or Flagyl 500mg twice a day for 7 days.  I have electronically sent this prescription into the pharmacy that you have chosen.  Be sure to take all of the medication as directed. Stop taking any medication if you develop a rash, tongue swelling or shortness of breath. Mothers who are breast feeding should consider pumping and discarding their breast milk while on these antibiotics. However, there is no consensus that infant exposure at these doses would be harmful.  Remember that  medication creams can weaken latex condoms. .   HOME CARE:  Good hygiene may prevent some types of vaginosis from recurring and may relieve some symptoms:  Avoid baths, hot tubs and whirlpool spas. Rinse soap from your outer genital area after a shower, and dry the area well to prevent irritation. Don't use scented or harsh soaps, such as those with deodorant or antibacterial action. Avoid irritants. These include scented tampons and pads. Wipe from front to back after using the toilet. Doing so avoids spreading fecal bacteria to your vagina.  Other things that may help prevent vaginosis include:  Don't douche. Your vagina doesn't require cleansing other than normal bathing. Repetitive douching disrupts the normal organisms that reside in the vagina and can actually increase your risk of vaginal infection. Douching won't clear up a vaginal infection. Use a latex condom. Both female and female latex condoms may help you avoid infections spread by sexual contact. Wear cotton underwear. Also wear pantyhose with a cotton crotch. If you feel comfortable without it, skip wearing underwear to bed. Yeast thrives in moist environments Your symptoms should improve in the next day or two.  GET HELP RIGHT AWAY IF:  You have pain in your lower abdomen ( pelvic area or over your ovaries) You develop nausea or vomiting You develop a fever Your discharge changes or worsens You have persistent pain with intercourse You develop shortness of breath, a rapid pulse, or you faint.  These symptoms could be signs of problems or infections that need to be evaluated by a medical provider now.  MAKE SURE YOU   Understand these instructions. Will watch your condition. Will get help right   away if you are not doing well or get worse.  Thank you for choosing an e-visit.  Your e-visit answers were reviewed by a board certified advanced clinical practitioner to complete your personal care plan. Depending upon the  condition, your plan could have included both over the counter or prescription medications.  Please review your pharmacy choice. Make sure the pharmacy is open so you can pick up prescription now. If there is a problem, you may contact your provider through MyChart messaging and have the prescription routed to another pharmacy.  Your safety is important to us. If you have drug allergies check your prescription carefully.   For the next 24 hours you can use MyChart to ask questions about today's visit, request a non-urgent call back, or ask for a work or school excuse. You will get an email in the next two days asking about your experience. I hope that your e-visit has been valuable and will speed your recovery.  I have spent 5 minutes in review of e-visit questionnaire, review and updating patient chart, medical decision making and response to patient.   Jennifer M Burnette, PA-C  

## 2022-12-12 ENCOUNTER — Ambulatory Visit (HOSPITAL_COMMUNITY)
Admission: EM | Admit: 2022-12-12 | Discharge: 2022-12-12 | Disposition: A | Discharge status: Home / Self Care | Payer: Medicaid Other | Attending: Internal Medicine | Admitting: Internal Medicine

## 2022-12-12 ENCOUNTER — Encounter (HOSPITAL_COMMUNITY): Payer: Self-pay

## 2022-12-12 DIAGNOSIS — B9689 Other specified bacterial agents as the cause of diseases classified elsewhere: Secondary | ICD-10-CM | POA: Diagnosis not present

## 2022-12-12 DIAGNOSIS — N898 Other specified noninflammatory disorders of vagina: Secondary | ICD-10-CM | POA: Insufficient documentation

## 2022-12-12 DIAGNOSIS — Z3202 Encounter for pregnancy test, result negative: Secondary | ICD-10-CM | POA: Diagnosis not present

## 2022-12-12 DIAGNOSIS — N760 Acute vaginitis: Secondary | ICD-10-CM | POA: Insufficient documentation

## 2022-12-12 DIAGNOSIS — N920 Excessive and frequent menstruation with regular cycle: Secondary | ICD-10-CM | POA: Insufficient documentation

## 2022-12-12 LAB — POCT URINALYSIS DIP (MANUAL ENTRY)
Bilirubin, UA: NEGATIVE
Blood, UA: NEGATIVE
Glucose, UA: NEGATIVE mg/dL
Ketones, POC UA: NEGATIVE mg/dL
Leukocytes, UA: NEGATIVE
Nitrite, UA: NEGATIVE
Protein Ur, POC: NEGATIVE mg/dL
Spec Grav, UA: 1.02 (ref 1.010–1.025)
Urobilinogen, UA: 0.2 U/dL
pH, UA: 7.5 (ref 5.0–8.0)

## 2022-12-12 LAB — POCT URINE PREGNANCY: Preg Test, Ur: NEGATIVE

## 2022-12-12 MED ORDER — METRONIDAZOLE 0.75 % VA GEL: 1.0000 | Freq: Every day | VAGINAL | 0 refills | Status: AC

## 2022-12-12 NOTE — ED Triage Notes (Addendum)
Patient having re-occurring bouts of BV and spotting in between periods along with low back pain. Patient requesting STD testing and pregnancy test.

## 2022-12-12 NOTE — Discharge Instructions (Addendum)
We will call you if anything on your swab returns positive. You can also see these results on MyChart. Please abstain from sexual intercourse until your results return.  I am treating you for BV - apply 1 application of the cream nightly for 5 nights.  Please call an ob/gyn to make a follow up appointment

## 2022-12-12 NOTE — ED Provider Notes (Signed)
[facility]    CSN: [id number] Arrival date & time: 12/12/22  0946     History   Chief Complaint Chief Complaint  Patient presents with   Back Pain   Vaginal Discharge   HPI [name] is a 32 y.o. female.  Here with vaginal discharge and odor Would like STD testing today History of recurrent BV. Last was 8/12, treated with flagyl pills which make her nauseous.   Also having spotting between cycles, sometimes post-coital. No dyspareunia.  LMP 8/30 Denies abdominal pain or cramping  Requesting pregnancy test today  Past Medical History:  Diagnosis Date   Anemia    Anemia    BV (bacterial vaginosis)    Chlamydia 2012    Patient Active Problem List   Diagnosis Date Noted   Hx of cesarean section complicating pregnancy 02/19/2012   Echogenic focus of heart of fetus affecting antepartum care of mother 02/19/2012   Supervision of high-risk pregnancy with insufficient prenatal care 02/19/2012   Trichomonas vaginalis (TV) infection 01/31/2012   Candida vaginitis 01/31/2012   Round ligament pain 01/31/2012   Hyperemesis gravidarum 08/14/2011    Past Surgical History:  Procedure Laterality Date   CESAREAN SECTION     TOOTH EXTRACTION  2010    OB History     Gravida  2   Para  2   Term  2   Preterm      AB      Living  2      SAB      IAB      Ectopic      Multiple      Live Births  2            Home Medications    Prior to Admission medications   Medication Sig Start Date End Date Taking? Authorizing Provider  metroNIDAZOLE (METROGEL) 0.75 % vaginal gel Place 1 Applicatorful vaginally at bedtime for 5 days. 12/12/22 12/17/22 Yes Srija Southard, Lurena Joiner, PA-C    Family History Family History  Problem Relation Age of Onset   Cancer Maternal Aunt    Healthy Mother    Anesthesia problems Neg Hx    Hypotension Neg Hx    Malignant hyperthermia Neg Hx    Pseudochol deficiency Neg Hx    Other Neg Hx     Social History Social  History   Tobacco Use   Smoking status: Former    Current packs/day: 0.00    Types: Cigarettes    Quit date: 07/12/2011    Years since quitting: 11.4   Smokeless tobacco: Never  Vaping Use   Vaping status: Never Used  Substance Use Topics   Alcohol use: Yes    Comment: occasionally   Drug use: Yes    Types: Marijuana     Allergies   Patient has no known allergies.   Review of Systems Review of Systems Per HPI  Physical Exam Triage Vital Signs ED Triage Vitals  Encounter Vitals Group     BP 12/12/22 1114 (!) 116/57     Systolic BP Percentile --      Diastolic BP Percentile --      Pulse Rate 12/12/22 1114 (!) 50     Resp 12/12/22 1114 16     Temp 12/12/22 1114 98.2 F (36.8 C)     Temp Source 12/12/22 1114 Oral     SpO2 12/12/22 1114 99 %     Weight --      Height 12/12/22 1114 5'  8" (1.727 m)     Head Circumference --      Peak Flow --      Pain Score 12/12/22 1113 4     Pain Loc --      Pain Education --      Exclude from Growth Chart --    No data found.  Updated Vital Signs BP (!) 116/57 (BP Location: Left Arm)   Pulse (!) 50   Temp 98.2 F (36.8 C) (Oral)   Resp 16   Ht 5\' 8"  (1.727 m)   LMP 11/29/2022 (Approximate)   SpO2 99%   BMI 18.40 kg/m    Physical Exam Vitals and nursing note reviewed.  Constitutional:      General: She is not in acute distress.    Appearance: Normal appearance.  Cardiovascular:     Rate and Rhythm: Normal rate and regular rhythm.     Heart sounds: Normal heart sounds.  Pulmonary:     Effort: Pulmonary effort is normal.     Breath sounds: Normal breath sounds.  Abdominal:     Palpations: Abdomen is soft.     Tenderness: There is no abdominal tenderness. There is no guarding.  Musculoskeletal:        General: Normal range of motion.  Skin:    General: Skin is warm and dry.  Neurological:     Mental Status: She is alert and oriented to person, place, and time.    UC Treatments / Results  Labs (all labs  ordered are listed, but only abnormal results are displayed) Labs Reviewed  POCT URINALYSIS DIP (MANUAL ENTRY) - Abnormal; Notable for the following components:      Result Value   Clarity, UA cloudy (*)    All other components within normal limits  POCT URINE PREGNANCY  CERVICOVAGINAL ANCILLARY ONLY    EKG  Radiology No results found.  Procedures Procedures   Medications Ordered in UC Medications - No data to display  Initial Impression / Assessment and Plan / UC Course  I have reviewed the triage vital signs and the nursing notes.  Pertinent labs & imaging results that were available during my care of the patient were reviewed by me and considered in my medical decision making (see chart for details).  UPT negative, UA normal Cytology swab pending Will treat for BV, offered the vaginal gel instead of the pills. Patient is willing to try. Metrogel nightly x 5. Also recommend close follow up with ob/gyn, provided with 2 clinics to call.  All questions answered, patient agreeable to plan  Final Clinical Impressions(s) / UC Diagnoses   Final diagnoses:  Vaginal discharge  BV (bacterial vaginosis)  Spotting  Negative pregnancy test     Discharge Instructions      We will call you if anything on your swab returns positive. You can also see these results on MyChart. Please abstain from sexual intercourse until your results return.  I am treating you for BV - apply 1 application of the cream nightly for 5 nights.  Please call an ob/gyn to make a follow up appointment     ED Prescriptions     Medication Sig Dispense Auth. Provider   metroNIDAZOLE (METROGEL) 0.75 % vaginal gel Place 1 Applicatorful vaginally at bedtime for 5 days. 70 g Jerlisa Diliberto, Lurena Joiner, PA-C      PDMP not reviewed this encounter.   Jenifer Struve, Lurena Joiner, [place] 12/12/22 1322

## 2022-12-13 LAB — CERVICOVAGINAL ANCILLARY ONLY
Bacterial Vaginitis (gardnerella): POSITIVE — AB
Candida Glabrata: NEGATIVE
Candida Vaginitis: POSITIVE — AB
Chlamydia: NEGATIVE
Comment: NEGATIVE
Comment: NEGATIVE
Comment: NEGATIVE
Comment: NEGATIVE
Comment: NEGATIVE
Comment: NORMAL
Neisseria Gonorrhea: NEGATIVE
Trichomonas: POSITIVE — AB

## 2022-12-14 ENCOUNTER — Telehealth: Payer: Self-pay

## 2022-12-14 MED ORDER — FLUCONAZOLE 150 MG PO TABS: 150.0000 mg | ORAL_TABLET | Freq: Once | ORAL | 0 refills | Status: AC

## 2022-12-14 MED ORDER — METRONIDAZOLE 500 MG PO TABS: 500.0000 mg | ORAL_TABLET | Freq: Two times a day (BID) | ORAL | 0 refills | Status: AC

## 2022-12-14 NOTE — Telephone Encounter (Signed)
Per protocol, pt requires tx with metronidazole. Attempted to reach patient x1. LVM.  Rx sent to pharmacy on file.

## 2023-01-08 ENCOUNTER — Ambulatory Visit: Payer: Medicaid Other | Admitting: Nurse Practitioner

## 2023-03-02 ENCOUNTER — Telehealth: Payer: Medicaid Other | Admitting: Physician Assistant

## 2023-03-02 DIAGNOSIS — N760 Acute vaginitis: Secondary | ICD-10-CM | POA: Diagnosis not present

## 2023-03-02 MED ORDER — FLUCONAZOLE 150 MG PO TABS: 150.0000 mg | ORAL_TABLET | Freq: Once | ORAL | 0 refills | Status: AC

## 2023-03-02 MED ORDER — METRONIDAZOLE 500 MG PO TABS: 500.0000 mg | ORAL_TABLET | Freq: Two times a day (BID) | ORAL | 0 refills | Status: AC

## 2023-03-02 NOTE — Progress Notes (Signed)

## 2023-03-02 NOTE — Progress Notes (Signed)
I have spent 5 minutes in review of e-visit questionnaire, review and updating patient chart, medical decision making and response to patient.   Laure Kidney, PA-C

## 2023-04-19 ENCOUNTER — Telehealth: Payer: Medicaid Other | Admitting: Physician Assistant

## 2023-04-19 DIAGNOSIS — J029 Acute pharyngitis, unspecified: Secondary | ICD-10-CM | POA: Diagnosis not present

## 2023-04-19 MED ORDER — PENICILLIN V POTASSIUM 500 MG PO TABS
500.0000 mg | ORAL_TABLET | Freq: Two times a day (BID) | ORAL | 0 refills | Status: AC
Start: 2023-04-19 — End: 2023-04-29

## 2023-04-19 NOTE — Progress Notes (Signed)

## 2023-04-28 ENCOUNTER — Telehealth: Payer: Medicaid Other | Admitting: Emergency Medicine

## 2023-04-28 DIAGNOSIS — R062 Wheezing: Secondary | ICD-10-CM

## 2023-04-28 NOTE — Progress Notes (Signed)
  Because you are having wheezing and feel you need an inhaler, I feel your condition warrants further evaluation and I recommend that you be seen in a face-to-face visit.   NOTE: There will be NO CHARGE for this E-Visit   If you are having a true medical emergency, please call 911.     For an urgent face to face visit, [place] has multiple urgent care centers for your convenience.  Click the link below for the full list of locations and hours, walk-in wait times, appointment scheduling options and driving directions:  Urgent Care - [place], [place], [place], [place], [place], [place]  [place]     Your MyChart E-visit questionnaire answers were reviewed by a board certified advanced clinical practitioner to complete your personal care plan based on your specific symptoms.    Thank you for using e-Visits.

## 2023-05-01 ENCOUNTER — Telehealth: Payer: Medicaid Other | Admitting: Physician Assistant

## 2023-05-01 DIAGNOSIS — N760 Acute vaginitis: Secondary | ICD-10-CM | POA: Diagnosis not present

## 2023-05-01 DIAGNOSIS — B9689 Other specified bacterial agents as the cause of diseases classified elsewhere: Secondary | ICD-10-CM

## 2023-05-01 MED ORDER — METRONIDAZOLE 500 MG PO TABS
500.0000 mg | ORAL_TABLET | Freq: Two times a day (BID) | ORAL | 0 refills | Status: DC
Start: 2023-05-01 — End: 2023-10-31

## 2023-05-01 NOTE — Progress Notes (Signed)

## 2023-05-01 NOTE — Progress Notes (Signed)
I have spent 5 minutes in review of e-visit questionnaire, review and updating patient chart, medical decision making and response to patient.   Piedad Climes, PA-C

## 2023-06-22 ENCOUNTER — Ambulatory Visit (HOSPITAL_COMMUNITY)
Admission: EM | Admit: 2023-06-22 | Discharge: 2023-06-22 | Disposition: A | Discharge status: Home / Self Care | Attending: Emergency Medicine | Admitting: Emergency Medicine

## 2023-06-22 ENCOUNTER — Other Ambulatory Visit: Payer: Self-pay

## 2023-06-22 ENCOUNTER — Encounter (HOSPITAL_COMMUNITY): Payer: Self-pay | Admitting: Emergency Medicine

## 2023-06-22 DIAGNOSIS — R5383 Other fatigue: Secondary | ICD-10-CM | POA: Insufficient documentation

## 2023-06-22 DIAGNOSIS — R531 Weakness: Secondary | ICD-10-CM | POA: Diagnosis present

## 2023-06-22 DIAGNOSIS — R630 Anorexia: Secondary | ICD-10-CM | POA: Diagnosis present

## 2023-06-22 DIAGNOSIS — Z862 Personal history of diseases of the blood and blood-forming organs and certain disorders involving the immune mechanism: Secondary | ICD-10-CM | POA: Diagnosis not present

## 2023-06-22 LAB — IRON AND TIBC
Iron: 130 ug/dL (ref 28–170)
Saturation Ratios: 40 % — ABNORMAL HIGH (ref 10.4–31.8)
TIBC: 322 ug/dL (ref 250–450)
UIBC: 192 ug/dL

## 2023-06-22 LAB — CBC WITH DIFFERENTIAL/PLATELET
Abs Immature Granulocytes: 0.01 10*3/uL (ref 0.00–0.07)
Basophils Absolute: 0 10*3/uL (ref 0.0–0.1)
Basophils Relative: 1 %
Eosinophils Absolute: 0 10*3/uL (ref 0.0–0.5)
Eosinophils Relative: 1 %
HCT: 41.5 % (ref 36.0–46.0)
Hemoglobin: 13.5 g/dL (ref 12.0–15.0)
Immature Granulocytes: 0 %
Lymphocytes Relative: 44 %
Lymphs Abs: 1.6 10*3/uL (ref 0.7–4.0)
MCH: 28.3 pg (ref 26.0–34.0)
MCHC: 32.5 g/dL (ref 30.0–36.0)
MCV: 87 fL (ref 80.0–100.0)
Monocytes Absolute: 0.5 10*3/uL (ref 0.1–1.0)
Monocytes Relative: 13 %
Neutro Abs: 1.5 10*3/uL — ABNORMAL LOW (ref 1.7–7.7)
Neutrophils Relative %: 41 %
Platelets: 148 10*3/uL — ABNORMAL LOW (ref 150–400)
RBC: 4.77 MIL/uL (ref 3.87–5.11)
RDW: 13.2 % (ref 11.5–15.5)
WBC: 3.7 10*3/uL — ABNORMAL LOW (ref 4.0–10.5)
nRBC: 0 % (ref 0.0–0.2)

## 2023-06-22 LAB — POCT URINALYSIS DIP (MANUAL ENTRY)
Bilirubin, UA: NEGATIVE
Glucose, UA: NEGATIVE mg/dL
Leukocytes, UA: NEGATIVE
Nitrite, UA: NEGATIVE
Protein Ur, POC: NEGATIVE mg/dL
Spec Grav, UA: 1.015 (ref 1.010–1.025)
Urobilinogen, UA: 0.2 U/dL
pH, UA: 5.5 (ref 5.0–8.0)

## 2023-06-22 LAB — POCT FASTING CBG KUC MANUAL ENTRY: POCT Glucose (KUC): 93 mg/dL (ref 70–99)

## 2023-06-22 NOTE — ED Provider Notes (Signed)
 [facility]    CSN: [id number] Arrival date & time: 06/22/23  1103    HISTORY   Chief Complaint  Patient presents with   Fatigue   HPI [name] is a pleasant, 33 y.o. female who presents to urgent care today. Pt reports increase fatigue for the past month, poor appetite and dry mouth.  The history is provided by the patient.   Past Medical History:  Diagnosis Date   Anemia    Anemia    BV (bacterial vaginosis)    Chlamydia 2012   Patient Active Problem List   Diagnosis Date Noted   Hx of cesarean section complicating pregnancy 02/19/2012   Echogenic focus of heart of fetus affecting antepartum care of mother 02/19/2012   Supervision of high-risk pregnancy with insufficient prenatal care 02/19/2012   Trichomonas vaginalis (TV) infection 01/31/2012   Candida vaginitis 01/31/2012   Round ligament pain 01/31/2012   Hyperemesis gravidarum 08/14/2011   Past Surgical History:  Procedure Laterality Date   CESAREAN SECTION     TOOTH EXTRACTION  2010   OB History     Gravida  2   Para  2   Term  2   Preterm      AB      Living  2      SAB      IAB      Ectopic      Multiple      Live Births  2          Home Medications    Prior to Admission medications   Medication Sig Start Date End Date Taking? Authorizing Provider  metroNIDAZOLE (FLAGYL) 500 MG tablet Take 1 tablet (500 mg total) by mouth 2 (two) times daily. 05/01/23   Waldon Merl, PA-C    Family History Family History  Problem Relation Age of Onset   Cancer Maternal Aunt    Healthy Mother    Anesthesia problems Neg Hx    Hypotension Neg Hx    Malignant hyperthermia Neg Hx    Pseudochol deficiency Neg Hx    Other Neg Hx    Social History Social History   Tobacco Use   Smoking status: Former    Current packs/day: 0.00    Types: Cigarettes    Quit date: 07/12/2011    Years since quitting: 11.9   Smokeless tobacco: Never  Vaping Use   Vaping status: Never  Used  Substance Use Topics   Alcohol use: Yes    Comment: occasionally   Drug use: Yes    Types: Marijuana   Allergies   Patient has no known allergies.  Review of Systems Review of Systems Pertinent findings revealed after performing a 14 point review of systems has been noted in the history of present illness.  Physical Exam Vital Signs BP 120/81 (BP Location: Right Arm)   Pulse 71   Temp 98 F (36.7 C) (Oral)   Resp 18   LMP 05/31/2023   SpO2 99%   No data found.  Physical Exam Vitals and nursing note reviewed.  Constitutional:      General: She is not in acute distress.    Appearance: Normal appearance.  HENT:     Head: Normocephalic and atraumatic.     Right Ear: Tympanic membrane, ear canal and external ear normal.     Left Ear: Tympanic membrane, ear canal and external ear normal.     Nose: Nose normal.     Mouth/Throat:  Mouth: Mucous membranes are dry.     Pharynx: Oropharynx is clear.  Eyes:     Extraocular Movements: Extraocular movements intact.     Conjunctiva/sclera: Conjunctivae normal.     Pupils: Pupils are equal, round, and reactive to light.  Cardiovascular:     Rate and Rhythm: Normal rate and regular rhythm.     Pulses: Normal pulses.  Pulmonary:     Effort: Pulmonary effort is normal.     Breath sounds: Normal breath sounds.  Abdominal:     General: Abdomen is flat. Bowel sounds are normal.     Palpations: Abdomen is soft.  Musculoskeletal:        General: Normal range of motion.     Cervical back: Normal range of motion and neck supple.  Skin:    General: Skin is warm and dry.  Neurological:     General: No focal deficit present.     Mental Status: She is alert and oriented to person, place, and time. Mental status is at baseline.     Cranial Nerves: No cranial nerve deficit.     Sensory: No sensory deficit.     Motor: No weakness.     Coordination: Coordination normal.     Gait: Gait normal.     Deep Tendon Reflexes: Reflexes  normal.  Psychiatric:        Mood and Affect: Mood normal.        Behavior: Behavior normal.        Thought Content: Thought content normal.        Judgment: Judgment normal.     Visual Acuity Right Eye Distance:   Left Eye Distance:   Bilateral Distance:    Right Eye Near:   Left Eye Near:    Bilateral Near:     UC Couse / Diagnostics / Procedures:     Radiology No results found.  Procedures Procedures (including critical care time) EKG  Pending results:  Labs Reviewed  POCT URINALYSIS DIP (MANUAL ENTRY) - Abnormal; Notable for the following components:      Result Value   Color, UA other (*)    Ketones, POC UA small (15) (*)    Blood, UA trace-lysed (*)    All other components within normal limits  CBC WITH DIFFERENTIAL/PLATELET  IRON AND TIBC  POCT FASTING CBG [facility] MANUAL ENTRY    Medications Ordered in UC: Medications - No data to display  UC Diagnoses / Final Clinical Impressions(s)   I have reviewed the triage vital signs and the nursing notes.  Pertinent labs & imaging results that were available during my care of the patient were reviewed by me and considered in my medical decision making (see chart for details).    Final diagnoses:  History of iron deficiency anemia  Fatigue, unspecified type  Generalized weakness  Loss of appetite   ***  Please see discharge instructions below for details of plan of care as provided to patient. ED Prescriptions   None    PDMP not reviewed this encounter.  Pending results:  Labs Reviewed  POCT URINALYSIS DIP (MANUAL ENTRY) - Abnormal; Notable for the following components:      Result Value   Color, UA other (*)    Ketones, POC UA small (15) (*)    Blood, UA trace-lysed (*)    All other components within normal limits  CBC WITH DIFFERENTIAL/PLATELET  IRON AND TIBC  POCT FASTING CBG [facility] MANUAL ENTRY      Discharge Instructions  Your urinalysis today was unremarkable and not concerning for  urinary tract infection or dehydration.  Your vital signs are very good.  I believe that what you are experiencing right now is due to your history of iron deficiency anemia.  We performed blood tests today to check your iron level and your blood counts.  The results of this test will be available within the next few days and we will contact you to let you know if any treatment is needed.  Please use the QR code at the end of this after visit summary to help you schedule an appointment with a primary care provider for further evaluation and regular follow-up of your history of iron deficiency anemia.  Thank you for visiting [facility] today.  We appreciate the opportunity to participate in your care.    Disposition Upon Discharge:  Condition: stable for discharge home  Patient presented with an acute illness with associated systemic symptoms and significant discomfort requiring urgent management. In my opinion, this is a condition that a prudent lay person (someone who possesses an average knowledge of health and medicine) may potentially expect to result in complications if not addressed urgently such as respiratory distress, impairment of bodily function or dysfunction of bodily organs.   Routine symptom specific, illness specific and/or disease specific instructions were discussed with the patient and/or caregiver at length.   As such, the patient has been evaluated and assessed, work-up was performed and treatment was provided in alignment with urgent care protocols and evidence based medicine.  Patient/parent/caregiver has been advised that the patient may require follow up for further testing and treatment if the symptoms continue in spite of treatment, as clinically indicated and appropriate.  Patient/parent/caregiver has been advised to return to the [facility] or PCP if no better; to PCP or the Emergency Department if new signs and symptoms develop, or if the current signs or symptoms  continue to change or worsen for further workup, evaluation and treatment as clinically indicated and appropriate  The patient will follow up with their current PCP if and as advised. If the patient does not currently have a PCP we will assist them in obtaining one.   The patient may need specialty follow up if the symptoms continue, in spite of conservative treatment and management, for further workup, evaluation, consultation and treatment as clinically indicated and appropriate.  Patient/parent/caregiver verbalized understanding and agreement of plan as discussed.  All questions were addressed during visit.  Please see discharge instructions below for further details of plan.  This office note has been dictated using Teaching laboratory technician.  Unfortunately, this method of dictation can sometimes lead to typographical or grammatical errors.  I apologize for your inconvenience in advance if this occurs.  Please do not hesitate to reach out to me if clarification is needed.

## 2023-06-22 NOTE — ED Triage Notes (Signed)
 Pt reports increase fatigue for the past month, poor appetite and dry mouth.

## 2023-06-22 NOTE — Discharge Instructions (Signed)
 Your urinalysis today was unremarkable and not concerning for urinary tract infection or dehydration.  Your vital signs are very good.  I believe that what you are experiencing right now is due to your history of iron deficiency anemia.  We performed blood tests today to check your iron level and your blood counts.  The results of this test will be available within the next few days and we will contact you to let you know if any treatment is needed.  Please use the QR code at the end of this after visit summary to help you schedule an appointment with a primary care provider for further evaluation and regular follow-up of your history of iron deficiency anemia.  Thank you for visiting [facility] today.  We appreciate the opportunity to participate in your care.

## 2023-06-23 ENCOUNTER — Encounter: Payer: Self-pay | Admitting: Emergency Medicine

## 2023-10-31 ENCOUNTER — Telehealth: Admitting: Physician Assistant

## 2023-10-31 DIAGNOSIS — B9689 Other specified bacterial agents as the cause of diseases classified elsewhere: Secondary | ICD-10-CM

## 2023-10-31 DIAGNOSIS — N760 Acute vaginitis: Secondary | ICD-10-CM | POA: Diagnosis not present

## 2023-10-31 MED ORDER — METRONIDAZOLE 500 MG PO TABS
500.0000 mg | ORAL_TABLET | Freq: Two times a day (BID) | ORAL | 0 refills | Status: AC
Start: 2023-10-31 — End: 2023-11-07

## 2023-10-31 NOTE — Progress Notes (Signed)

## 2023-11-20 ENCOUNTER — Telehealth: Admitting: Physician Assistant

## 2023-11-20 DIAGNOSIS — B350 Tinea barbae and tinea capitis: Secondary | ICD-10-CM

## 2023-11-20 DIAGNOSIS — B354 Tinea corporis: Secondary | ICD-10-CM

## 2023-11-20 MED ORDER — KETOCONAZOLE 2 % EX SHAM
1.0000 | MEDICATED_SHAMPOO | CUTANEOUS | 0 refills | Status: AC
Start: 2023-11-20 — End: ?

## 2023-11-20 MED ORDER — CLOTRIMAZOLE-BETAMETHASONE 1-0.05 % EX CREA
1.0000 | TOPICAL_CREAM | Freq: Every day | CUTANEOUS | 0 refills | Status: AC
Start: 2023-11-20 — End: ?

## 2023-11-20 MED ORDER — FLUCONAZOLE 150 MG PO TABS: 150.0000 mg | ORAL_TABLET | Freq: Once | ORAL | 0 refills | Status: DC

## 2023-11-20 MED ORDER — FLUCONAZOLE 150 MG PO TABS
150.0000 mg | ORAL_TABLET | ORAL | 0 refills | Status: AC
Start: 2023-11-20 — End: ?

## 2023-11-20 NOTE — Patient Instructions (Signed)
 [name], thank you for joining Emily Velma Lunger, PA-C for today's virtual visit.  While this provider is not your primary care provider (PCP), if your PCP is located in our provider database this encounter information will be shared with them immediately following your visit.   A [facility] MyChart account gives you access to today's visit and all your visits, tests, and labs performed at [facility]  click here if you don't have a [facility] MyChart account or go to [email or web address]  Consent: (Patient) [name] provided verbal consent for this virtual visit at the beginning of the encounter.  Current Medications: No current outpatient medications on file.   Medications ordered in this encounter:  No orders of the defined types were placed in this encounter.    *If you need refills on other medications prior to your next appointment, please contact your pharmacy*  Follow-Up: Call back or seek an in-person evaluation if the symptoms worsen or if the condition fails to improve as anticipated.   Virtual Care [phone]  Other Instructions Body Ringworm Body ringworm is an infection of the skin that often causes a ring-shaped rash. Body ringworm is also called tinea corporis. Body ringworm can affect any part of your skin. This condition is easily spread from person to person (is very contagious). What are the causes? This condition is caused by fungi called dermatophytes. The condition develops when these fungi grow out of control on the skin. You can get this condition if you touch a person or animal that has it. You can also get it if you share any items with an infected person or pet. These include: Clothing, bedding, and towels. Brushes or combs. Gym equipment. Any other object that has the fungus on it. What increases the risk? You are more likely to develop this condition if you: Play sports that involve close physical  contact, such as wrestling. Sweat a lot. Live in areas that are hot and humid. Use public showers. Have a weakened disease-fighting system (immune system). What are the signs or symptoms? Symptoms of this condition include: Itchy, raised red spots and bumps. Red scaly patches. A ring-shaped rash. The rash may have: A clear center. Scales or red bumps at its center. Redness near its borders. Dry and scaly skin on or around it. How is this diagnosed? This condition can usually be diagnosed with a skin exam. A skin scraping may be taken from the affected area and examined under a microscope to see if the fungus is present. How is this treated? This condition may be treated with: An antifungal cream or ointment. An antifungal shampoo. Antifungal medicines. These may be prescribed if your ringworm: Is severe. Keeps coming back or lasts a long time. Follow these instructions at home: Take over-the-counter and prescription medicines only as told by your health care provider. If you were given an antifungal cream or ointment: Use it as told by your health care provider. Wash the infected area and dry it completely before applying the cream or ointment. If you were given an antifungal shampoo: Use it as told by your health care provider. Leave the shampoo on your body for 3-5 minutes before rinsing. While you have a rash: Wear loose clothing to stop clothes from rubbing and irritating it. Wash or change your bed sheets every night. Wash clothes and bed sheets in hot water . Disinfect or throw out items that may be infected. Wash your hands often with soap and water  for at least  20 seconds. If soap and water  are not available, use hand sanitizer. If your pet has the same infection, take your pet to see a veterinarian for treatment. How is this prevented? Take a bath or shower every day and after every time you work out or play sports. Dry your skin completely after bathing. Wear sandals  or shoes in public places and showers. Wash athletic clothes after each use. Do not share personal items with others. Avoid touching red patches of skin on other people. Avoid touching pets that have bald spots. If you touch an animal that has a bald spot, wash your hands. Contact a health care provider if: Your rash continues to spread after 7 days of treatment. Your rash is not gone in 4 weeks. The area around your rash gets red, warm, tender, and swollen. This information is not intended to replace advice given to you by your health care provider. Make sure you discuss any questions you have with your health care provider. Document Revised: 08/30/2021 Document Reviewed: 08/30/2021 Elsevier Patient Education  2024 Elsevier Inc.   If you have been instructed to have an in-person evaluation today at a local Urgent Care facility, please use the link below. It will take you to a list of all of our available [facility], including address, phone number and hours of operation. Please do not delay care.  [facility]  If you or a family member do not have a primary care provider, use the link below to schedule a visit and establish care. When you choose a [facility] primary care physician or advanced practice provider, you gain a long-term partner in health. Find a Primary Care Provider  Learn more about [facility] in-office and virtual care options:  - Get Care Now

## 2023-11-20 NOTE — Progress Notes (Signed)
 Virtual Visit Consent   [name], you are scheduled for a virtual visit with a [facility] provider today. Just as with appointments in the office, your consent must be obtained to participate. Your consent will be active for this visit and any virtual visit you may have with one of our providers in the next 365 days. If you have a MyChart account, a copy of this consent can be sent to you electronically.  As this is a virtual visit, video technology does not allow for your provider to perform a traditional examination. This may limit your provider's ability to fully assess your condition. If your provider identifies any concerns that need to be evaluated in person or the need to arrange testing (such as labs, EKG, etc.), we will make arrangements to do so. Although advances in technology are sophisticated, we cannot ensure that it will always work on either your end or our end. If the connection with a video visit is poor, the visit may have to be switched to a telephone visit. With either a video or telephone visit, we are not always able to ensure that we have a secure connection.  By engaging in this virtual visit, you consent to the provision of healthcare and authorize for your insurance to be billed (if applicable) for the services provided during this visit. Depending on your insurance coverage, you may receive a charge related to this service.  I need to obtain your verbal consent now. Are you willing to proceed with your visit today? [name] has provided verbal consent on 11/20/2023 for a virtual visit (video or telephone). [name], [place]  Date: 11/20/2023 5:01 PM   Virtual Visit via Video Note   I, [name], connected with  [name]  ([id number], 1990-07-24) on 11/20/23 at  4:15 PM EDT by a video-enabled telemedicine application and verified that I am speaking with the correct person using two identifiers.  Location: Patient: Virtual Visit Location  Patient: Home Provider: Virtual Visit Location Provider: Home Office   I discussed the limitations of evaluation and management by telemedicine and the availability of in person appointments. The patient expressed understanding and agreed to proceed.    History of Present Illness: [name] is a 33 y.o. who identifies as a female who was assigned female at birth, and is being seen today for dry, itchy spots over the past few weeks. First noted on her arm as a raised and itchy, round lesions to which she applied OTC anti-itch creams. Notes some improvement in the area but other spots showing up on her neck and now scalp, causing itching and associated hair loss. Denies fever, chills.    HPI: HPI  Problems:  Patient Active Problem List   Diagnosis Date Noted   Hx of cesarean section complicating pregnancy 02/19/2012   Echogenic focus of heart of fetus affecting antepartum care of mother 02/19/2012   Supervision of high-risk pregnancy with insufficient prenatal care 02/19/2012   Trichomonas vaginalis (TV) infection 01/31/2012   Candida vaginitis 01/31/2012   Round ligament pain 01/31/2012   Hyperemesis gravidarum 08/14/2011    Allergies: No Known Allergies Medications:  Current Outpatient Medications:    clotrimazole -betamethasone  (LOTRISONE ) cream, Apply 1 Application topically daily., Disp: 30 g, Rfl: 0   fluconazole  (DIFLUCAN ) 150 MG tablet, Take 1 tablet (150 mg total) by mouth once for 1 dose., Disp: 1 tablet, Rfl: 0   ketoconazole  (NIZORAL ) 2 % shampoo, Apply 1 Application topically 2 (two) times a week.,  Disp: 120 mL, Rfl: 0  Observations/Objective: Patient is well-developed, well-nourished in no acute distress.  Resting comfortably at home.  Head is normocephalic, atraumatic.  No labored breathing. Speech is clear and coherent with logical content.  Patient is alert and oriented at baseline.  Annular lesions that are hyperpigmented, with raised borders noted of lower  abdomen and left lateral neck, concerning for tinea corporis. She has a few smaller patches in occipital region of scalp that are annual with central clearing and hair loss.  Assessment and Plan: 1. Tinea capitis (Primary) - fluconazole  (DIFLUCAN ) 150 MG tablet; Take 1 tablet (150 mg total) by mouth once for 1 dose.  Dispense: 1 tablet; Refill: 0 - ketoconazole  (NIZORAL ) 2 % shampoo; Apply 1 Application topically 2 (two) times a week.  Dispense: 120 mL; Refill: 0  2. Tinea corporis - fluconazole  (DIFLUCAN ) 150 MG tablet; Take 1 tablet (150 mg total) by mouth once for 1 dose.  Dispense: 1 tablet; Refill: 0 - clotrimazole -betamethasone  (LOTRISONE ) cream; Apply 1 Application topically daily.  Dispense: 30 g; Refill: 0  Supportive measures and OTC medications reviewed. Start Topical Lotrisone  for areas of body, sparing use of the face or groin. Topical Ketoconazole  shampoo for tinea capitis.  Diflucan  once week for 3 weeks.  Follow-up in person for any non-resolving, new or worsening symptoms despite treatment.  Follow Up Instructions: I discussed the assessment and treatment plan with the patient. The patient was provided an opportunity to ask questions and all were answered. The patient agreed with the plan and demonstrated an understanding of the instructions.  A copy of instructions were sent to the patient via MyChart unless otherwise noted below.   The patient was advised to call back or seek an in-person evaluation if the symptoms worsen or if the condition fails to improve as anticipated.    Emily Velma Lunger, PA-C
